# Patient Record
Sex: Female | Born: 1935 | Marital: Married | Smoking: Never smoker
Health system: Southern US, Community
[De-identification: ages and names within clinical notes are randomized; demographics above are authoritative.]

## PROBLEM LIST (undated history)

## (undated) DIAGNOSIS — E119 Type 2 diabetes mellitus without complications: Secondary | ICD-10-CM

## (undated) DIAGNOSIS — I639 Cerebral infarction, unspecified: Secondary | ICD-10-CM

## (undated) DIAGNOSIS — E785 Hyperlipidemia, unspecified: Secondary | ICD-10-CM

## (undated) DIAGNOSIS — I10 Essential (primary) hypertension: Secondary | ICD-10-CM

## (undated) HISTORY — DX: Type 2 diabetes mellitus without complications: E11.9

## (undated) HISTORY — DX: Hyperlipidemia, unspecified: E78.5

---

## 2010-10-01 ENCOUNTER — Emergency Department: Payer: Self-pay | Admitting: Emergency Medicine

## 2011-11-21 ENCOUNTER — Observation Stay: Payer: Self-pay | Admitting: Internal Medicine

## 2011-11-21 LAB — BASIC METABOLIC PANEL
Calcium, Total: 8.8 mg/dL (ref 8.5–10.1)
Chloride: 103 mmol/L (ref 98–107)
Co2: 26 mmol/L (ref 21–32)
EGFR (African American): >60
EGFR (Non-African Amer.): >60
Glucose: 482 mg/dL — ABNORMAL HIGH (ref 65–99)
Osmolality: 300 (ref 275–301)
Potassium: 4.1 mmol/L (ref 3.5–5.1)

## 2011-11-21 LAB — URINALYSIS, COMPLETE
Bilirubin,UR: NEGATIVE
Blood: NEGATIVE
Ketone: NEGATIVE
Nitrite: NEGATIVE
Protein: NEGATIVE
RBC,UR: 1 /HPF (ref 0–5)
WBC UR: 12 /HPF (ref 0–5)

## 2011-11-21 LAB — CBC
MCHC: 35.5 g/dL (ref 32.0–36.0)
Platelet: 142 10*3/uL — ABNORMAL LOW (ref 150–440)
RDW: 13.6 % (ref 11.5–14.5)
WBC: 3.8 10*3/uL (ref 3.6–11.0)

## 2011-11-21 LAB — HEPATIC FUNCTION PANEL A (ARMC)
Albumin: 4 g/dL (ref 3.4–5.0)
Alkaline Phosphatase: 98 U/L (ref 50–136)
Bilirubin,Total: 0.4 mg/dL (ref 0.2–1.0)
SGOT(AST): 21 U/L (ref 15–37)
SGPT (ALT): 19 U/L (ref 12–78)

## 2011-11-21 LAB — TROPONIN I: Troponin-I: <0.02 ng/mL

## 2011-11-21 LAB — CK TOTAL AND CKMB (NOT AT ARMC)
CK, Total: 56 U/L (ref 21–215)
CK-MB: 1.3 ng/mL (ref 0.5–3.6)

## 2011-12-26 LAB — CBC AND DIFFERENTIAL: Platelets: 132 — AB (ref 150–399)

## 2011-12-26 LAB — LIPID PANEL: Triglycerides: 237 — AB (ref 40–160)

## 2013-12-22 LAB — LIPID PANEL: Cholesterol: 212 — AB (ref 0–200)

## 2014-01-17 LAB — LIPID PANEL: LDL Cholesterol: 131

## 2014-01-27 LAB — BASIC METABOLIC PANEL
Glucose: 143
Glucose: 143
Glucose: 143

## 2014-01-27 LAB — HEMOGLOBIN A1C
Hemoglobin A1C: 7.2
Hemoglobin A1C: 7.2

## 2014-01-27 LAB — LIPID PANEL: LDL Cholesterol: 131

## 2014-04-02 ENCOUNTER — Emergency Department: Payer: Self-pay | Admitting: Emergency Medicine

## 2014-05-19 NOTE — H&P (Signed)
PATIENT NAME:  Annette, Crawford MR#:  [id number] DATE OF BIRTH:  01/30/1932  DATE OF ADMISSION:  11/21/2011  PRIMARY CARE PHYSICIAN: She has no local doctor.   REFERRING PHYSICIAN: Dr. Clemens Catholic.    CHIEF COMPLAINT: Chest pain.   HISTORY OF PRESENT ILLNESS: Annette Crawford is a 80 year old pleasant Caucasian female originally from the [place]. She comes to the [place] from time to time for a visit for a while. She has history of diabetes mellitus type 2, but she is out of her medications for more than a year. She is not checking her diabetes. Today, she was complaining of feeling unwell, a little dizziness and also she complains of chest pain for the last one year. However, the chest pain today was worse than ever, located at the midsternal area slightly towards the right of the midchest. Described as pressure-like feeling. The severity is about 3 to 4 on a scale of 10. She adds to that it is like pressure or something sitting on the chest. Pain is nonradiating. There is mild shortness of breath. No associated sweating or palpitations or nausea or vomiting. She indicates that the pain is worse upon walking.   REVIEW OF SYSTEMS: CONSTITUTIONAL: Denies any fever. No chills. No fatigue. EYES: No blurring of vision. No double vision. ENT: No hearing impairment. No sore throat. No dysphagia. CARDIOVASCULAR: Reports the chest pain as above and mild shortness of breath. No syncope. No palpitations. No edema. RESPIRATORY: No cough. No sputum production. No hemoptysis. GASTROINTESTINAL: No abdominal pain. No vomiting. No diarrhea. GENITOURINARY: No dysuria or frequency of urination. MUSCULOSKELETAL: No joint pain or swelling. No muscular pain or swelling. INTEGUMENTARY: No skin rash. No ulcer. She has a small lump on the back of the right side of the neck for a long time. She had surgery to that but it came back according to the patient. It appears it is a cyst. This has not changed. She indicates  that she had removal of this lesion before and she was told it was benign. NEUROLOGY: No focal weakness. No seizure activity. No headache. PSYCHIATRY: No anxiety. No depression. ENDOCRINE: She has mild polyuria and mild polydipsia. No heat or cold intolerance.   PAST MEDICAL HISTORY:  1. Diabetes mellitus type 2, noncompliant with her medications or visits.  2. She denies history of hypertension, although she has hypertension.  3. No past history of coronary artery disease or stroke or kidney disease.   PAST SURGICAL HISTORY: She had surgery to benign lump on the back of her neck many years ago and it recurred and was slowly growing for many years.   SOCIAL HABITS: Nonsmoker. No history of alcohol or drug abuse.   FAMILY HISTORY: She reports that her mother had diabetes mellitus, type II and she also suffered from the bowel cancer. She has a brother who has leukemia and a sister who has colon cancer.   SOCIAL HISTORY: She is married. Right now she is living with her son and she travels between the [place] and back to [place] to [place].   ADMISSION MEDICATIONS: None. However, her medications before for diabetes used to be metformin 1000 mg twice a day and glipizide 5 mg a day, but she is not taking them.   ALLERGIES: No known drug allergies.   PHYSICAL EXAMINATION:  VITAL SIGNS: Blood pressure 159/78, respiratory rate 18, pulse 64, temperature 98, oxygen saturation 99%.   GENERAL APPEARANCE: Elderly female lying in bed in no acute  distress.   HEAD: No pallor. No icterus. No cyanosis.   ENT: Hearing was normal. Nasal mucosa, lips, tongue were normal.   EYES: Normal eyelids and conjunctivae. Pupils about 3 mm, equal, sluggishly reactive to light.   NECK: Supple. Trachea at midline. No thyromegaly. No cervical lymphadenopathy.   HEART: Normal S1, S2. No S3, S4. No murmur. No gallop. No carotid bruits.   LUNGS: Normal breathing pattern without use of accessory muscles. No  rales. No wheezing.   ABDOMEN: Soft without tenderness. No hepatosplenomegaly. No masses. No hernias.   SKIN: No ulcers. No subcutaneous nodules.   MUSCULOSKELETAL: No joint swelling. No clubbing.   NEUROLOGIC: Cranial nerves II through XII are intact. No focal motor deficit.   PSYCHIATRIC: The patient is alert and oriented x3. Mood and affect were normal.   LABORATORY, DIAGNOSTIC, AND RADIOLOGICAL DATA: Chest x-ray showed heart size was normal. No acute cardiopulmonary abnormalities. EKG showed normal sinus rhythm at rate of 71 per minute. Nonspecific T wave abnormalities in Leads III and aVF, otherwise unremarkable EKG. Blood work-up showed blood sugar 503, BUN 17, creatinine 0.8, sodium 139, potassium 4.1 and hemoglobin A1c was 12. Normal liver function tests. CPK 56. Troponin less than 0.02. CBC showed white count of 3,800, hemoglobin 14, hematocrit 40, platelet count 142. Urinalysis showed more than 500 glucose, 12 white blood cells and otherwise unremarkable.   ASSESSMENT:  1. Chest pain for evaluation. Given her risk factors, it needs further evaluation given her risk factors given the age, diabetes and underlying hypertension.  2. Elevated blood pressure, may indicate underlying hypertension.  3. Diabetes mellitus type 2, uncontrolled.  4. Mild urinary tract infection.   PLAN:  1. We will admit the patient for observation, IV hydration, Accu-Cheks and follow-up on blood sugar. The latest blood sugar after treatment is down to 286.  2. We will follow up on her troponin q.8 hours x2. Will schedule the patient to have dobutamine Cardiolite stress test or dobutamine Myoview since the patient is unable to walk fast on the treadmill.  3. I will start ACE inhibitor, lisinopril 10 mg a day.  4. Resume metformin and glipizide.  5. Aspirin 325 mg p.o. daily.  6. I will place her on nitroglycerin sublingual 0.4 mg every five minutes p.r.n. should she have chest pain.  7. The patient was  advised to have regular follow-up with a local physician.  8. I understood from the patient that she does not have a LIVING WILL and her CODE STATUS is FULL CODE.   TIME SPENT EVALUATING THIS PATIENT: More than 55 minutes.   ____________________________ Carney CornersAmir M. Rudene Rearwish, MD amd:ap D: 11/21/2011 06:08:06 ET T: 11/21/2011 08:48:16 ET JOB#: [id number]  cc: Carney CornersAmir M. Rudene Rearwish, MD, <Dictator> Zollie ScaleAMIR M Chrissa Meetze MD ELECTRONICALLY SIGNED 11/21/2011 22:32

## 2014-05-19 NOTE — Discharge Summary (Signed)
PATIENT NAME:  Annette Crawford, Annette Crawford MR#:  [id number] DATE OF Crawford:  01/30/1932  DATE OF ADMISSION:  11/21/2011 DATE OF DISCHARGE:  11/21/2011  PRIMARY CARE PHYSICIAN: Beverely RisenFozia Khan, MD   PRESENTING COMPLAINT: Chest pain on and off for one year and uncontrolled blood sugars.   DISCHARGE DIAGNOSES:  1. Chest pain, appears atypical. The patient was ruled out for acute myocardial infarction with three sets of negative cardiac enzymes and normal Myoview stress test, ejection fraction of 62%.  2. Uncontrolled type 2 diabetes due to medical noncompliance.  3. Hypertension, new diagnosis.   PROCEDURE: Myoview stress test normal, negative for ischemia, ejection fraction of 62%.    FOLLOWUP: Follow up with Dr. Beverely RisenFozia Khan 12/04/2011 at 8:30 a.m.   MEDICATIONS AT DISCHARGE:  1. Metformin 500 mg, 2 tablets b.i.d. 2. Glipizide 5 mg, 1 tablet daily.  3. Aspirin 81 mg daily.  4. Lisinopril 10 mg daily.  5. Metoprolol 25 mg p.o. b.i.d.   DIET: Low sodium, carbohydrate controlled diet.   CONSULTATIONS: None.   LABORATORY, DIAGNOSTIC AND RADIOLOGICAL DATA: Cardiac enzymes x3 negative. Urinalysis negative for urinary tract infection. Positive for glucosuria. Negative for proteinuria. Chest x-ray: No acute disease of the chest. CT of the head shows no acute intracranial process. CBC was within normal limits except platelet count of 142. Basic metabolic panel within normal limits except glucose of 482. LFTs within normal limits. Hemoglobin A1c is 12.0. EKG showed normal sinus rhythm, minimal voltage criteria for left ventricular hypertrophy.   HOSPITAL COURSE: Annette Crawford is a 80 year old female of [place] origin who speaks Arabic, presents to the Emergency Room with:   1. Chest pain: Her history was obtained via an Arabic interpreter. The patient reported having on and off chest discomfort for one year. She was not evaluated as outpatient. She does not follow up with a physician since she has  been in the [place]. She has been off of her medications as well. The patient was admitted. She was ruled out with three sets of negative cardiac enzymes, normal EKG, and normal Myoview stress test. Her symptoms appeared atypical.  2. Hypertension, new diagnosis: The patient was started on beta blockers and lisinopril.  3. Type 2 diabetes, uncontrolled in the setting of noncompliance to medication, no followup with physician here in the [place]: The patient was resumed back on her glipizide and metoprolol. We did make an appointment for her to be seen by Dr. Beverely RisenFozia Khan. The importance for follow-up appointment was discussed with the patient's son over the phone.   Overall hospital stay remained stable. The discharge plan was discussed with the patient's son.  CODE STATUS:  The patient remained a FULL CODE.    TIME SPENT: 40 minutes   ____________________________ Jearl KlinefelterSona A. Allena KatzPatel, MD sap:cbb D: 11/22/2011 07:09:28 ET T: 11/22/2011 09:35:52 ET JOB#: [id number]  cc: Jakelin Taussig A. Allena KatzPatel, MD, <Dictator> Lyndon CodeFozia M. Khan, MD Willow OraSONA A Nithin Demeo MD ELECTRONICALLY SIGNED 11/22/2011 11:25

## 2014-06-01 ENCOUNTER — Encounter: Payer: Self-pay | Admitting: Emergency Medicine

## 2014-06-01 ENCOUNTER — Emergency Department: Payer: Medicaid Other

## 2014-06-01 ENCOUNTER — Other Ambulatory Visit: Payer: Self-pay

## 2014-06-01 ENCOUNTER — Emergency Department
Admission: EM | Admit: 2014-06-01 | Discharge: 2014-06-02 | Disposition: A | Discharge status: Home / Self Care | Payer: Medicaid Other | Attending: Emergency Medicine | Admitting: Emergency Medicine

## 2014-06-01 DIAGNOSIS — R531 Weakness: Secondary | ICD-10-CM | POA: Diagnosis not present

## 2014-06-01 DIAGNOSIS — E119 Type 2 diabetes mellitus without complications: Secondary | ICD-10-CM | POA: Insufficient documentation

## 2014-06-01 DIAGNOSIS — R112 Nausea with vomiting, unspecified: Secondary | ICD-10-CM

## 2014-06-01 DIAGNOSIS — I10 Essential (primary) hypertension: Secondary | ICD-10-CM | POA: Diagnosis not present

## 2014-06-01 HISTORY — DX: Essential (primary) hypertension: I10

## 2014-06-01 HISTORY — DX: Cerebral infarction, unspecified: I63.9

## 2014-06-01 HISTORY — DX: Type 2 diabetes mellitus without complications: E11.9

## 2014-06-01 LAB — CBC WITH DIFFERENTIAL/PLATELET
Basophils Absolute: 0 10*3/uL (ref 0–0.1)
Basophils Relative: 0 %
Eosinophils Absolute: 0 10*3/uL (ref 0–0.7)
Eosinophils Relative: 1 %
HCT: 35.9 % (ref 35.0–47.0)
Hemoglobin: 11.9 g/dL — ABNORMAL LOW (ref 12.0–16.0)
LYMPHS PCT: 13 %
Lymphs Abs: 0.9 10*3/uL — ABNORMAL LOW (ref 1.0–3.6)
MCH: 28.1 pg (ref 26.0–34.0)
MCHC: 33.1 g/dL (ref 32.0–36.0)
MCV: 84.9 fL (ref 80.0–100.0)
MONO ABS: 0.5 10*3/uL (ref 0.2–0.9)
Monocytes Relative: 7 %
NEUTROS PCT: 79 %
Neutro Abs: 5.3 10*3/uL (ref 1.4–6.5)
PLATELETS: 148 10*3/uL — AB (ref 150–440)
RBC: 4.23 MIL/uL (ref 3.80–5.20)
RDW: 14.1 % (ref 11.5–14.5)
WBC: 6.7 10*3/uL (ref 3.6–11.0)

## 2014-06-01 LAB — COMPREHENSIVE METABOLIC PANEL
ALT: 12 U/L — ABNORMAL LOW (ref 14–54)
ANION GAP: 10 (ref 5–15)
AST: 22 U/L (ref 15–41)
Albumin: 4.2 g/dL (ref 3.5–5.0)
Alkaline Phosphatase: 59 U/L (ref 38–126)
BUN: 21 mg/dL — AB (ref 6–20)
CHLORIDE: 99 mmol/L — AB (ref 101–111)
CO2: 28 mmol/L (ref 22–32)
Calcium: 9.3 mg/dL (ref 8.9–10.3)
Creatinine, Ser: 0.93 mg/dL (ref 0.44–1.00)
GFR calc Af Amer: >60 mL/min (ref >60)
GFR calc non Af Amer: 57 mL/min — ABNORMAL LOW (ref >60)
GLUCOSE: 121 mg/dL — AB (ref 65–99)
Potassium: 4.3 mmol/L (ref 3.5–5.1)
Sodium: 137 mmol/L (ref 135–145)
TOTAL PROTEIN: 7.1 g/dL (ref 6.5–8.1)
Total Bilirubin: 0.7 mg/dL (ref 0.3–1.2)

## 2014-06-01 LAB — TROPONIN I

## 2014-06-01 LAB — LIPASE, BLOOD: Lipase: 36 U/L (ref 22–51)

## 2014-06-01 MED ORDER — LISINOPRIL-HYDROCHLOROTHIAZIDE 20-12.5 MG PO TABS: 1.0000 | ORAL_TABLET | Freq: Two times a day (BID) | ORAL | Status: DC

## 2014-06-01 MED ORDER — IOHEXOL 240 MG/ML SOLN: 25.0000 mL | Freq: Once | INTRAMUSCULAR | Status: AC | PRN

## 2014-06-01 MED ORDER — ATORVASTATIN CALCIUM 10 MG PO TABS: 10.0000 mg | ORAL_TABLET | Freq: Every day | ORAL | Status: DC

## 2014-06-01 MED ORDER — GLIMEPIRIDE 2 MG PO TABS: 2.0000 mg | ORAL_TABLET | Freq: Three times a day (TID) | ORAL | Status: DC

## 2014-06-01 MED ORDER — IOHEXOL 300 MG/ML  SOLN
100.0000 mL | Freq: Once | INTRAMUSCULAR | Status: AC | PRN
  Administered 2014-06-01: 100 mL via INTRAVENOUS

## 2014-06-01 NOTE — ED Notes (Signed)
MD at bedside. 

## 2014-06-01 NOTE — ED Notes (Signed)
Pt resting quietly in stretcher, resp even and unlabored. Call bell within reach.  No needs expressed at this time. Will continue to monitor.

## 2014-06-01 NOTE — ED Notes (Signed)
Pt unable to use bedpan at this time. Pt going to CT at this time, will in&out once she returns from CT.

## 2014-06-01 NOTE — ED Notes (Signed)
Pt to CT at this time.

## 2014-06-01 NOTE — ED Notes (Signed)
Family states via interpreter language line that pt has had n,v and weakness since 1000 this am, has had 4 previous stroke in the past per family, pt denies pain, pt has 4 family member with her all family member giving information to interpreter at the same time, difficult to get good history

## 2014-06-01 NOTE — Discharge Instructions (Signed)

## 2014-06-01 NOTE — ED Notes (Signed)
Pt states able to try and urinate at this time, placed on bedpan, tolerating well at this time. Pt asked for RN to give her a minute alone, RN will check back in momentarily.

## 2014-06-01 NOTE — ED Provider Notes (Signed)
[facility] Emergency Department Provider Note    ____________________________________________  Time seen: 21:00  I have reviewed the triage vital signs and the nursing notes.   HISTORY  Chief Complaint Weakness and Emesis   History is limited by language; the patient and family aren't native aerobics speakers. The interpreter line was used when possible, and a family member who does speak English was also utilized.    HPI Annette Crawford is a 80 y.o. female with a history significant for hypertension, diabetes, and prior strokes 4 who presents with rapid onset of generalized weakness, nausea, and multiple episodes of vomiting since earlier this morning. In the past when she has had a CVA she has had more focal neuro deficits, and in fact she does not have the use of her left hand due to a prior stroke. Today she has not complaining of any neuro deficits, just persistent vomiting and generalized weakness. The symptoms began approximately 11 hours ago.She has not had an appetite today and has been vomiting in spite of not eating. She denies chest pain and shortness of breath.   Past Medical History  Diagnosis Date  . Diabetes mellitus without complication   . Hypertension   . Stroke     There are no active problems to display for this patient.   History reviewed. No pertinent past surgical history.  No current outpatient prescriptions on file.  Allergies Review of patient's allergies indicates no known allergies.  History reviewed. No pertinent family history.  Social History History  Substance Use Topics  . Smoking status: Never Smoker   . Smokeless tobacco: Not on file  . Alcohol Use: No    Review of Systems  Constitutional: Negative for fever. Eyes: Negative for visual changes. ENT: Negative for sore throat. Cardiovascular: Negative for chest pain. Respiratory: Negative for shortness of breath. Gastrointestinal: Negative  for abdominal pain, positive for several episodes of vomiting.. Genitourinary: Negative for dysuria. Musculoskeletal: Negative for back pain. Skin: Negative for rash. Neurological: Negative for headaches, focal weakness or numbness.   10-point ROS otherwise negative.  ____________________________________________   PHYSICAL EXAM:  VITAL SIGNS: ED Triage Vitals  Enc Vitals Group     BP 06/01/14 1614 152/61 mmHg     Pulse Rate 06/01/14 1614 95     Resp 06/01/14 1654 18     Temp 06/01/14 1614 98.1 F (36.7 C)     Temp Source 06/01/14 1614 Oral     SpO2 06/01/14 1614 99 %     Weight 06/01/14 1614 121 lb (54.885 kg)     Height 06/01/14 1614  (1.702 m)     Head Cir --      Peak Flow --      Pain Score --      Pain Loc --      Pain Edu? --      Excl. in GC? --      Constitutional: Alert and oriented. Well appearing and in no distress. Eyes: Conjunctivae are normal. PERRL. Normal extraocular movements. ENT   Head: Normocephalic and atraumatic.   Nose: No congestion/rhinnorhea.   Mouth/Throat: Mucous membranes are moist.   Neck: No stridor. Hematological/Lymphatic/Immunilogical: No cervical lymphadenopathy. Cardiovascular: Normal rate, regular rhythm. Normal and symmetric distal pulses are present in all extremities. No murmurs, rubs, or gallops. Respiratory: Normal respiratory effort without tachypnea nor retractions. Breath sounds are clear and equal bilaterally. No wheezes/rales/rhonchi. Gastrointestinal: Soft with diffuse lower extremity tenderness to palpation. No guarding.. No distention.  No abdominal bruits. There is no CVA tenderness. Genitourinary: Deferred Musculoskeletal: Nontender with normal range of motion in all extremities. No joint effusions.  No lower extremity tenderness nor edema. Neurologic:  Normal speech and language. No acute gross focal neurologic deficits are appreciated; patient has chronic inability to use left upper extremity,  specifically no grip strength. Speech is normal.  Skin:  Skin is warm, dry and intact. No rash noted. Psychiatric: Mood and affect are normal. Speech and behavior are normal. Patient exhibits appropriate insight and judgment.  ____________________________________________    LABS (pertinent positives/negatives)  Chemistry, CBC, and troponin x2 are all unremarkable.  ____________________________________________   EKG  ED ECG REPORT   Date: 06/01/2014  EKG Time: 16:47  Rate: 68  Rhythm: normal sinus rhythm  Axis: Normal  Intervals:none  ST&T Change: Nonspecific ST and T-wave changes   ____________________________________________    RADIOLOGY  Head CT report from radiology describes white matter ischemic changes in the right cerebral hemisphere that are new from the prior CT but appear chronic. There is also evidence of a small remote left cerebellar infarction.  CT scan of the abdomen and pelvis with IV contrast is unremarkable  ____________________________________________   PROCEDURES  Procedure(s) performed: None  Critical Care performed: No  ____________________________________________   INITIAL IMPRESSION / ASSESSMENT AND PLAN / ED COURSE  Pertinent labs & imaging results that were available during my care of the patient were reviewed by me and considered in my medical decision making (see chart for details).  The patient is in no acute distress though she has had apparently 3-4 episodes of vomiting today with decreased by mouth intake. Her vital signs are stable and her initial blood work is unremarkable. She is tender to palpation of the abdomen, however, so further investigation is warranted. I will evaluate with a urinalysis and a CT abdomen and pelvis. At this point I do not believe that her symptoms are result of an acute CVA, though given her history of multiple CVAs which continue to include this on the differential  diagnosis.  ----------------------------------------- 11:43 PM on 06/01/2014 -----------------------------------------  The patient's CT scan was unremarkable and she has been in no distress during her lengthy emergency department stay. Her vital signs remained stable. Again, there are no signs that she is having an acute CVA. I am awaiting a second troponin, but I anticipate discussing via the language line with the patient's family the encouraging results and recommending close outpatient follow-up. I also refilled some of the patient's chronic medications for blood pressure and diabetes and hypercholesterolemia because her primary care doctor is out of town and the family was worried that she would not have access to medication. ____________________________________________   FINAL CLINICAL IMPRESSION(S) / ED DIAGNOSES  Final diagnoses:  Nausea and vomiting, vomiting of unspecified type     Loleta Roseory Merric Yost, MD 06/02/14 0028

## 2014-06-02 ENCOUNTER — Encounter: Payer: Self-pay | Admitting: Emergency Medicine

## 2014-06-02 LAB — URINALYSIS COMPLETE WITH MICROSCOPIC (ARMC ONLY)
BACTERIA UA: NONE SEEN
Bilirubin Urine: NEGATIVE
GLUCOSE, UA: NEGATIVE mg/dL
HGB URINE DIPSTICK: NEGATIVE
KETONES UR: NEGATIVE mg/dL
Leukocytes, UA: NEGATIVE
Nitrite: NEGATIVE
Protein, ur: NEGATIVE mg/dL
SPECIFIC GRAVITY, URINE: 1.019 (ref 1.005–1.030)
SQUAMOUS EPITHELIAL / LPF: NONE SEEN
pH: 7 (ref 5.0–8.0)

## 2014-06-02 LAB — TROPONIN I: TROPONIN I: 0.03 ng/mL (ref <0.031)

## 2014-06-02 NOTE — ED Notes (Signed)
Calling lab at this time to obtain update on trop results

## 2014-06-02 NOTE — ED Provider Notes (Signed)
-----------------------------------------   1:19 AM on 06/02/2014 -----------------------------------------  Assuming care from Dr. York CeriseForbach.  In short, Annette Crawford is a 80 y.o. female with a chief complaint of Weakness and Emesis .  Refer to the original H&P for additional details.  The patient's urinalysis and second troponin have resulted as normal. We'll discharge the patient home with primary care follow-up. Patient agreeable to plan.   Minna AntisKevin Cheryel Kyte, MD 06/02/14 [phone]

## 2014-06-02 NOTE — ED Notes (Signed)
Awaiting UA and troponin results from lab. Per lab they are in process.

## 2014-06-02 NOTE — ED Notes (Signed)
Pt alert and in NAD at time of d/c to family via w/c.

## 2014-09-18 ENCOUNTER — Other Ambulatory Visit: Payer: Self-pay | Admitting: Family Medicine

## 2014-09-18 ENCOUNTER — Encounter: Payer: Self-pay | Admitting: Family Medicine

## 2014-09-18 ENCOUNTER — Ambulatory Visit: Payer: Self-pay | Admitting: Family Medicine

## 2014-09-18 DIAGNOSIS — E1151 Type 2 diabetes mellitus with diabetic peripheral angiopathy without gangrene: Secondary | ICD-10-CM | POA: Insufficient documentation

## 2014-09-18 DIAGNOSIS — I69354 Hemiplegia and hemiparesis following cerebral infarction affecting left non-dominant side: Secondary | ICD-10-CM | POA: Insufficient documentation

## 2014-09-18 DIAGNOSIS — E785 Hyperlipidemia, unspecified: Secondary | ICD-10-CM | POA: Insufficient documentation

## 2014-09-18 DIAGNOSIS — I10 Essential (primary) hypertension: Secondary | ICD-10-CM | POA: Insufficient documentation

## 2014-09-18 DIAGNOSIS — R202 Paresthesia of skin: Secondary | ICD-10-CM | POA: Insufficient documentation

## 2014-12-04 ENCOUNTER — Other Ambulatory Visit: Payer: Self-pay | Admitting: Family Medicine

## 2014-12-04 MED ORDER — GLIMEPIRIDE 2 MG PO TABS: 2.0000 mg | ORAL_TABLET | Freq: Three times a day (TID) | ORAL | Status: DC

## 2014-12-04 MED ORDER — ATORVASTATIN CALCIUM 10 MG PO TABS: 10.0000 mg | ORAL_TABLET | Freq: Every day | ORAL | Status: DC

## 2014-12-04 MED ORDER — LISINOPRIL-HYDROCHLOROTHIAZIDE 20-12.5 MG PO TABS: 1.0000 | ORAL_TABLET | Freq: Two times a day (BID) | ORAL | Status: DC

## 2014-12-04 MED ORDER — METFORMIN HCL 500 MG PO TABS: 500.0000 mg | ORAL_TABLET | Freq: Three times a day (TID) | ORAL | Status: DC

## 2014-12-16 ENCOUNTER — Ambulatory Visit (INDEPENDENT_AMBULATORY_CARE_PROVIDER_SITE_OTHER): Payer: Medicaid Other | Admitting: Family Medicine

## 2014-12-16 ENCOUNTER — Encounter: Payer: Self-pay | Admitting: Family Medicine

## 2014-12-16 VITALS — BP 126/62 | HR 89 | Temp 98.0°F | Resp 12 | Wt 134.8 lb

## 2014-12-16 DIAGNOSIS — E1151 Type 2 diabetes mellitus with diabetic peripheral angiopathy without gangrene: Secondary | ICD-10-CM

## 2014-12-16 DIAGNOSIS — R202 Paresthesia of skin: Secondary | ICD-10-CM

## 2014-12-16 DIAGNOSIS — E785 Hyperlipidemia, unspecified: Secondary | ICD-10-CM

## 2014-12-16 DIAGNOSIS — I10 Essential (primary) hypertension: Secondary | ICD-10-CM

## 2014-12-16 MED ORDER — ACCU-CHEK SOFT TOUCH LANCETS MISC: Status: DC

## 2014-12-16 MED ORDER — ACCU-CHEK NANO SMARTVIEW W/DEVICE KIT: 1.0000 | PACK | Freq: Three times a day (TID) | Status: DC

## 2014-12-16 MED ORDER — METFORMIN HCL 500 MG PO TABS: 500.0000 mg | ORAL_TABLET | Freq: Three times a day (TID) | ORAL | Status: AC

## 2014-12-16 MED ORDER — LISINOPRIL-HYDROCHLOROTHIAZIDE 20-12.5 MG PO TABS: 1.0000 | ORAL_TABLET | Freq: Two times a day (BID) | ORAL | Status: AC

## 2014-12-16 MED ORDER — GLUCOSE BLOOD VI STRP: ORAL_STRIP | Status: DC

## 2014-12-16 MED ORDER — ATORVASTATIN CALCIUM 10 MG PO TABS: 10.0000 mg | ORAL_TABLET | Freq: Every day | ORAL | Status: AC

## 2014-12-16 MED ORDER — GLIMEPIRIDE 2 MG PO TABS: 2.0000 mg | ORAL_TABLET | Freq: Three times a day (TID) | ORAL | Status: DC

## 2014-12-16 NOTE — Progress Notes (Signed)
Name: Annette Crawford   MRN: [id number]    DOB: [birth year]   Date:12/16/2014       Progress Note  Subjective  Chief Complaint  Chief Complaint  Patient presents with  . Medication Refill    HPI  Annette Crawford is a 80 year old female with known history of DM II, HTN, HLD, CVA with residual deficits left upper extremity worse than lower. Today she is here for 6 month follow up of diabetes type 2. Son and granddaughter accompany her today. Language barrier noted. Patient does not speak or understand [place] and family has very broken understanding of [place]. Interpretive services used. Son explains that [name] has had a dry cough some times for months. Otherwise left arm still weak and shoulder can be painful. They are also requesting a new glucometer and testing supplies.   Past Medical History  Diagnosis Date  . Diabetes mellitus without complication ([place])   . Hypertension   . Stroke ([place])   . Hyperlipidemia     Patient Active Problem List   Diagnosis Date Noted  . History of CVA (cerebrovascular accident) without residual deficits 09/18/2014  . Paresthesia of left upper extremity 09/18/2014  . Controlled type 2 DM with peripheral circulatory disorder ([place]) 09/18/2014  . Hyperlipidemia LDL goal <70 09/18/2014  . Hypertension goal BP (blood pressure) < 140/90 09/18/2014    Social History  Substance Use Topics  . Smoking status: Never Smoker   . Smokeless tobacco: Not on file  . Alcohol Use: No     Current outpatient prescriptions:  .  atorvastatin (LIPITOR) 10 MG tablet, Take 1 tablet (10 mg total) by mouth daily., Disp: 90 tablet, Rfl: 2 .  Blood Glucose Monitoring Suppl (ACCU-CHEK NANO SMARTVIEW) W/DEVICE KIT, 1 Device by Does not apply route 3 (three) times daily., Disp: 1 kit, Rfl: 0 .  glimepiride (AMARYL) 2 MG tablet, Take 1 tablet (2 mg total) by mouth 3 (three) times daily., Disp: 270 tablet, Rfl: 2 .  glucose blood (ACCU-CHEK SMARTVIEW) test strip, Use as  instructed, Disp: 100 each, Rfl: 12 .  Lancets (ACCU-CHEK SOFT TOUCH) lancets, Use as instructed to check blood glucose 3x/day, Disp: 100 each, Rfl: 5 .  lisinopril-hydrochlorothiazide (ZESTORETIC) 20-12.5 MG tablet, Take 1 tablet by mouth 2 (two) times daily., Disp: 180 tablet, Rfl: 2 .  metFORMIN (GLUCOPHAGE) 500 MG tablet, Take 1 tablet (500 mg total) by mouth 3 (three) times daily., Disp: 270 tablet, Rfl: 2  History reviewed. No pertinent past surgical history.  Family History  Problem Relation Age of Onset  . Hyperlipidemia Mother   . Hypertension Mother   . Hyperlipidemia Father   . Hypertension Father     No Known Allergies   Review of Systems  CONSTITUTIONAL: No significant weight changes, fever, chills, weakness or fatigue.  SKIN: No rash or itching.  CARDIOVASCULAR: No chest pain, chest pressure or chest discomfort. No palpitations or edema.  RESPIRATORY: No shortness of breath. NEUROLOGICAL: No headache, dizziness, syncope, paralysis, ataxia. Yes numbness or tingling in the left upper extremity. No memory changes. No change in bowel or bladder control.  MUSCULOSKELETAL: Yes joint pain. No muscle pain. PSYCHIATRIC: No change in mood. No change in sleep pattern.  ENDOCRINOLOGIC: No reports of sweating, cold or heat intolerance. No polyuria or polydipsia.     Objective  BP 126/62 mmHg  Pulse 89  Temp(Src) 98 F (36.7 C) (Oral)  Resp 12  Wt 134 lb 12.8 oz (61.145 kg)  SpO2 97%  Body mass index is 26.33 kg/(m^2).  Physical Exam  Constitutional: Patient appears well-developed and well-nourished. In no distress. Quite but cooperative. Using cane to walk. Cardiovascular: Normal rate, regular rhythm and normal heart sounds.  No murmur heard.  Pulmonary/Chest: Effort normal and breath sounds normal. No respiratory distress. Musculoskeletal: Left LE 4/5 strength, Left UE 3/5 strength. Sensation intact.  Peripheral vascular: Bilateral LE no edema. Neurological: CN  II-XII grossly intact with no focal deficits. Alert and oriented to person, place, and time.  Skin: Skin is warm and dry. No rash noted. No erythema.  Psychiatric: Patient has a normal mood and affect. Behavior is normal in office today. Judgment and thought content normal in office today.   Assessment & Plan   1. Controlled type 2 DM with peripheral circulatory disorder (HCC) Unable to get sufficient blood sample for in office Hba1c testing, will send to lab.  - Hemoglobin A1c - Lancets (ACCU-CHEK SOFT TOUCH) lancets; Use as instructed to check blood glucose 3x/day  Dispense: 100 each; Refill: 5 - Blood Glucose Monitoring Suppl (ACCU-CHEK NANO SMARTVIEW) W/DEVICE KIT; 1 Device by Does not apply route 3 (three) times daily.  Dispense: 1 kit; Refill: 0 - glucose blood (ACCU-CHEK SMARTVIEW) test strip; Use as instructed  Dispense: 100 each; Refill: 12 - glimepiride (AMARYL) 2 MG tablet; Take 1 tablet (2 mg total) by mouth 3 (three) times daily.  Dispense: 270 tablet; Refill: 2 - metFORMIN (GLUCOPHAGE) 500 MG tablet; Take 1 tablet (500 mg total) by mouth 3 (three) times daily.  Dispense: 270 tablet; Refill: 2  2. Paresthesia of left upper extremity Stable.  3. Hyperlipidemia LDL goal <70 Recheck.  - Lipid panel - atorvastatin (LIPITOR) 10 MG tablet; Take 1 tablet (10 mg total) by mouth daily.  Dispense: 90 tablet; Refill: 2  4. Hypertension goal BP (blood pressure) < 140/90 Stable.  - lisinopril-hydrochlorothiazide (ZESTORETIC) 20-12.5 MG tablet; Take 1 tablet by mouth 2 (two) times daily.  Dispense: 180 tablet; Refill: 2

## 2014-12-17 LAB — LIPID PANEL
Chol/HDL Ratio: 3.8 ratio units (ref 0.0–4.4)
Cholesterol, Total: 149 mg/dL (ref 100–199)
HDL: 39 mg/dL — ABNORMAL LOW (ref >39)
LDL CALC: 81 mg/dL (ref 0–99)
Triglycerides: 146 mg/dL (ref 0–149)
VLDL CHOLESTEROL CAL: 29 mg/dL (ref 5–40)

## 2014-12-17 LAB — HEMOGLOBIN A1C
Est. average glucose Bld gHb Est-mCnc: 131 mg/dL
Hgb A1c MFr Bld: 6.2 % — ABNORMAL HIGH (ref 4.8–5.6)

## 2014-12-30 ENCOUNTER — Telehealth: Payer: Self-pay | Admitting: Family Medicine

## 2014-12-30 ENCOUNTER — Telehealth: Payer: Self-pay

## 2014-12-30 ENCOUNTER — Other Ambulatory Visit: Payer: Self-pay | Admitting: Family Medicine

## 2014-12-30 DIAGNOSIS — E1151 Type 2 diabetes mellitus with diabetic peripheral angiopathy without gangrene: Secondary | ICD-10-CM

## 2014-12-30 MED ORDER — GLUCOSE BLOOD VI STRP: 1.0000 | ORAL_STRIP | Freq: Three times a day (TID) | Status: DC

## 2014-12-30 MED ORDER — ACCU-CHEK AVIVA PLUS W/DEVICE KIT: 1.0000 | PACK | Freq: Every day | Status: DC

## 2014-12-30 MED ORDER — ACCU-CHEK SOFTCLIX LANCETS MISC: 1.0000 | Freq: Three times a day (TID) | Status: DC

## 2014-12-30 NOTE — Telephone Encounter (Signed)
Pharmacist called to get clarity on glucometer. Dr. Sherley BoundsSundaram answered her questions.

## 2014-12-30 NOTE — Telephone Encounter (Signed)
[facility] SAID THAT THEY ALSO NEED RX FOR LANCETS FOR THE ACCU-CHECK AVIA PLUS.

## 2015-01-18 ENCOUNTER — Encounter: Payer: Self-pay | Admitting: *Deleted

## 2015-01-18 ENCOUNTER — Emergency Department: Payer: Medicaid Other

## 2015-01-18 ENCOUNTER — Observation Stay
Admission: EM | Admit: 2015-01-18 | Discharge: 2015-01-20 | Disposition: A | Discharge status: Home / Self Care | Payer: Medicaid Other | Attending: Internal Medicine | Admitting: Internal Medicine

## 2015-01-18 DIAGNOSIS — G459 Transient cerebral ischemic attack, unspecified: Secondary | ICD-10-CM | POA: Diagnosis present

## 2015-01-18 DIAGNOSIS — R29898 Other symptoms and signs involving the musculoskeletal system: Secondary | ICD-10-CM

## 2015-01-18 DIAGNOSIS — Z961 Presence of intraocular lens: Secondary | ICD-10-CM | POA: Insufficient documentation

## 2015-01-18 DIAGNOSIS — R079 Chest pain, unspecified: Secondary | ICD-10-CM | POA: Insufficient documentation

## 2015-01-18 DIAGNOSIS — F0630 Mood disorder due to known physiological condition, unspecified: Secondary | ICD-10-CM | POA: Diagnosis not present

## 2015-01-18 DIAGNOSIS — J9811 Atelectasis: Secondary | ICD-10-CM | POA: Diagnosis not present

## 2015-01-18 DIAGNOSIS — F329 Major depressive disorder, single episode, unspecified: Secondary | ICD-10-CM | POA: Diagnosis not present

## 2015-01-18 DIAGNOSIS — Z7984 Long term (current) use of oral hypoglycemic drugs: Secondary | ICD-10-CM | POA: Insufficient documentation

## 2015-01-18 DIAGNOSIS — R531 Weakness: Secondary | ICD-10-CM | POA: Insufficient documentation

## 2015-01-18 DIAGNOSIS — E785 Hyperlipidemia, unspecified: Secondary | ICD-10-CM | POA: Diagnosis not present

## 2015-01-18 DIAGNOSIS — R52 Pain, unspecified: Secondary | ICD-10-CM

## 2015-01-18 DIAGNOSIS — K449 Diaphragmatic hernia without obstruction or gangrene: Secondary | ICD-10-CM | POA: Diagnosis not present

## 2015-01-18 DIAGNOSIS — R1013 Epigastric pain: Secondary | ICD-10-CM | POA: Insufficient documentation

## 2015-01-18 DIAGNOSIS — R0602 Shortness of breath: Secondary | ICD-10-CM | POA: Diagnosis not present

## 2015-01-18 DIAGNOSIS — M8580 Other specified disorders of bone density and structure, unspecified site: Secondary | ICD-10-CM | POA: Diagnosis not present

## 2015-01-18 DIAGNOSIS — I10 Essential (primary) hypertension: Secondary | ICD-10-CM | POA: Diagnosis not present

## 2015-01-18 DIAGNOSIS — K209 Esophagitis, unspecified: Secondary | ICD-10-CM | POA: Insufficient documentation

## 2015-01-18 DIAGNOSIS — K760 Fatty (change of) liver, not elsewhere classified: Secondary | ICD-10-CM | POA: Insufficient documentation

## 2015-01-18 DIAGNOSIS — I69354 Hemiplegia and hemiparesis following cerebral infarction affecting left non-dominant side: Secondary | ICD-10-CM | POA: Diagnosis not present

## 2015-01-18 DIAGNOSIS — Z79899 Other long term (current) drug therapy: Secondary | ICD-10-CM | POA: Diagnosis not present

## 2015-01-18 DIAGNOSIS — E1151 Type 2 diabetes mellitus with diabetic peripheral angiopathy without gangrene: Secondary | ICD-10-CM | POA: Diagnosis not present

## 2015-01-18 DIAGNOSIS — R112 Nausea with vomiting, unspecified: Secondary | ICD-10-CM | POA: Diagnosis present

## 2015-01-18 DIAGNOSIS — K5900 Constipation, unspecified: Secondary | ICD-10-CM | POA: Diagnosis not present

## 2015-01-18 LAB — CBC
HEMATOCRIT: 35.6 % (ref 35.0–47.0)
Hemoglobin: 11.8 g/dL — ABNORMAL LOW (ref 12.0–16.0)
MCH: 28.4 pg (ref 26.0–34.0)
MCHC: 33 g/dL (ref 32.0–36.0)
MCV: 86.1 fL (ref 80.0–100.0)
PLATELETS: 142 10*3/uL — AB (ref 150–440)
RBC: 4.14 MIL/uL (ref 3.80–5.20)
RDW: 13.6 % (ref 11.5–14.5)
WBC: 5.1 10*3/uL (ref 3.6–11.0)

## 2015-01-18 LAB — HEPATIC FUNCTION PANEL
ALT: 11 U/L — ABNORMAL LOW (ref 14–54)
AST: 19 U/L (ref 15–41)
Albumin: 4 g/dL (ref 3.5–5.0)
Alkaline Phosphatase: 56 U/L (ref 38–126)
BILIRUBIN DIRECT: 0.3 mg/dL (ref 0.1–0.5)
BILIRUBIN INDIRECT: 0.6 mg/dL (ref 0.3–0.9)
TOTAL PROTEIN: 6.7 g/dL (ref 6.5–8.1)
Total Bilirubin: 0.9 mg/dL (ref 0.3–1.2)

## 2015-01-18 LAB — BASIC METABOLIC PANEL
Anion gap: 7 (ref 5–15)
BUN: 24 mg/dL — AB (ref 6–20)
CALCIUM: 8.9 mg/dL (ref 8.9–10.3)
CO2: 26 mmol/L (ref 22–32)
CREATININE: 0.84 mg/dL (ref 0.44–1.00)
Chloride: 105 mmol/L (ref 101–111)
GFR calc Af Amer: >60 mL/min (ref >60)
Glucose, Bld: 95 mg/dL (ref 65–99)
POTASSIUM: 4.3 mmol/L (ref 3.5–5.1)
SODIUM: 138 mmol/L (ref 135–145)

## 2015-01-18 LAB — URINALYSIS COMPLETE WITH MICROSCOPIC (ARMC ONLY)
BILIRUBIN URINE: NEGATIVE
Bacteria, UA: NONE SEEN
GLUCOSE, UA: NEGATIVE mg/dL
HGB URINE DIPSTICK: NEGATIVE
Ketones, ur: NEGATIVE mg/dL
NITRITE: NEGATIVE
Protein, ur: NEGATIVE mg/dL
SPECIFIC GRAVITY, URINE: 1.008 (ref 1.005–1.030)
pH: 6 (ref 5.0–8.0)

## 2015-01-18 LAB — TROPONIN I

## 2015-01-18 LAB — LIPASE, BLOOD: LIPASE: 29 U/L (ref 11–51)

## 2015-01-18 MED ORDER — ONDANSETRON HCL 4 MG/2ML IJ SOLN: 4.0000 mg | Freq: Four times a day (QID) | INTRAMUSCULAR | Status: DC | PRN

## 2015-01-18 MED ORDER — ONDANSETRON HCL 4 MG PO TABS: 4.0000 mg | ORAL_TABLET | Freq: Every day | ORAL | Status: DC | PRN

## 2015-01-18 MED ORDER — ACETAMINOPHEN 650 MG RE SUPP: 650.0000 mg | Freq: Four times a day (QID) | RECTAL | Status: DC | PRN

## 2015-01-18 MED ORDER — SODIUM CHLORIDE 0.9 % IV SOLN
INTRAVENOUS | Status: DC
  Administered 2015-01-19 (×3): via INTRAVENOUS

## 2015-01-18 MED ORDER — IOHEXOL 240 MG/ML SOLN
25.0000 mL | Freq: Once | INTRAMUSCULAR | Status: DC | PRN
Start: 2015-01-18 — End: 2015-01-20

## 2015-01-18 MED ORDER — LOPERAMIDE HCL 2 MG PO CAPS
ORAL_CAPSULE | ORAL | Status: AC
  Filled 2015-01-18: qty 1

## 2015-01-18 MED ORDER — ONDANSETRON HCL 4 MG PO TABS: 4.0000 mg | ORAL_TABLET | Freq: Four times a day (QID) | ORAL | Status: DC | PRN

## 2015-01-18 MED ORDER — PANTOPRAZOLE SODIUM 40 MG PO TBEC
40.0000 mg | DELAYED_RELEASE_TABLET | Freq: Two times a day (BID) | ORAL | Status: DC
  Filled 2015-01-18: qty 1

## 2015-01-18 MED ORDER — SODIUM CHLORIDE 0.9 % IJ SOLN
3.0000 mL | Freq: Two times a day (BID) | INTRAMUSCULAR | Status: DC
  Administered 2015-01-19 – 2015-01-20 (×3): 3 mL via INTRAVENOUS

## 2015-01-18 MED ORDER — SODIUM CHLORIDE 0.9 % IV BOLUS (SEPSIS)
1000.0000 mL | Freq: Once | INTRAVENOUS | Status: AC
  Administered 2015-01-18: 1000 mL via INTRAVENOUS

## 2015-01-18 MED ORDER — HYDRALAZINE HCL 20 MG/ML IJ SOLN: 10.0000 mg | INTRAMUSCULAR | Status: DC | PRN

## 2015-01-18 MED ORDER — OXYCODONE HCL 5 MG PO TABS: 5.0000 mg | ORAL_TABLET | ORAL | Status: DC | PRN

## 2015-01-18 MED ORDER — PANTOPRAZOLE SODIUM 40 MG IV SOLR
40.0000 mg | Freq: Once | INTRAVENOUS | Status: AC
  Administered 2015-01-18: 40 mg via INTRAVENOUS
  Filled 2015-01-18: qty 40

## 2015-01-18 MED ORDER — ACETAMINOPHEN 325 MG PO TABS: 650.0000 mg | ORAL_TABLET | Freq: Four times a day (QID) | ORAL | Status: DC | PRN

## 2015-01-18 MED ORDER — IOHEXOL 300 MG/ML  SOLN
100.0000 mL | Freq: Once | INTRAMUSCULAR | Status: AC | PRN
  Administered 2015-01-18: 100 mL via INTRAVENOUS

## 2015-01-18 MED ORDER — MORPHINE SULFATE (PF) 2 MG/ML IV SOLN
2.0000 mg | INTRAVENOUS | Status: DC | PRN
Start: 2015-01-18 — End: 2015-01-19

## 2015-01-18 NOTE — H&P (Signed)
[facility] - [place] [facility]   PATIENT NAME: Annette Crawford    MR#:  [id number]  DATE OF BIRTH:  Jun 15, 1934   DATE OF ADMISSION:  01/18/2015  PRIMARY CARE PHYSICIAN: Edwena Felty, MD   REQUESTING/REFERRING PHYSICIAN: McShane  CHIEF COMPLAINT:  Abdominal pain, nausea, left arm weakness   HISTORY OF PRESENT ILLNESS:  Annette Crawford  is a 80 y.o. female with a known history of CVA with residual left-sided weakness type 2 diabetes non-insulin-requiring hypertension essential who is presenting with abdominal pain and nausea as well as worsening left-sided weakness. History obtained through video interpreter. The patient and family described 2 day duration of symptoms including nausea, vomiting of nonbloody nonbilious emesis, associated epigastric pain-unable to further quantify/qualify pain, poor by mouth intake, as well as worsening left-sided weakness. Of note she did have 1 episode of diaphoresis which sounds like a hypoglycemic episode as she continued to take her medications without eating those symptoms for several resolved. She presented to Hospital now for worsening symptoms.  PAST MEDICAL HISTORY:   Past Medical History  Diagnosis Date  . Diabetes mellitus without complication (HCC)   . Hypertension   . Stroke (HCC)   . Hyperlipidemia     PAST SURGICAL HISTORY:  History reviewed. No pertinent past surgical history.  SOCIAL HISTORY:   Social History  Substance Use Topics  . Smoking status: Never Smoker   . Smokeless tobacco: Not on file  . Alcohol Use: No    FAMILY HISTORY:   Family History  Problem Relation Age of Onset  . Hyperlipidemia Mother   . Hypertension Mother   . Hyperlipidemia Father   . Hypertension Father     DRUG ALLERGIES:  No Known Allergies  REVIEW OF SYSTEMS:  REVIEW OF SYSTEMS:  CONSTITUTIONAL: Denies fevers, chills, positive fatigue, weakness.  EYES: Denies blurred vision, double vision, or  eye pain.  EARS, NOSE, THROAT: Denies tinnitus, ear pain, hearing loss.  RESPIRATORY: denies cough, shortness of breath, wheezing  CARDIOVASCULAR: Denies chest pain, palpitations, edema.  GASTROINTESTINAL: Positive nausea, vomiting, abdominal pain.  denies diarrhea GENITOURINARY: Denies dysuria, hematuria.  ENDOCRINE: Denies nocturia or thyroid problems. HEMATOLOGIC AND LYMPHATIC: Denies easy bruising or bleeding.  SKIN: Denies rash or lesions.  MUSCULOSKELETAL: Denies pain in neck, back, shoulder, knees, hips, or further arthritic symptoms.  NEUROLOGIC: Denies paralysis, paresthesias.  positive left-sided weakness  PSYCHIATRIC: Denies anxiety or depressive symptoms. Otherwise full review of systems performed by me is negative.   MEDICATIONS AT HOME:   Prior to Admission medications   Medication Sig Start Date End Date Taking? Authorizing Provider  atorvastatin (LIPITOR) 10 MG tablet Take 1 tablet (10 mg total) by mouth daily. 12/16/14 12/16/15 Yes Edwena Felty, MD  glimepiride (AMARYL) 2 MG tablet Take 1 tablet (2 mg total) by mouth 3 (three) times daily. 12/16/14 12/16/15 Yes Edwena Felty, MD  lisinopril-hydrochlorothiazide (ZESTORETIC) 20-12.5 MG tablet Take 1 tablet by mouth 2 (two) times daily. 12/16/14 12/16/15 Yes Edwena Felty, MD  metFORMIN (GLUCOPHAGE) 500 MG tablet Take 1 tablet (500 mg total) by mouth 3 (three) times daily. 12/16/14  Yes Edwena Felty, MD  ondansetron (ZOFRAN) 4 MG tablet Take 1 tablet (4 mg total) by mouth daily as needed for nausea or vomiting. 01/18/15   Jeanmarie Plant, MD      VITAL SIGNS:  Blood pressure 133/62, pulse 66, temperature 99.1 F (37.3 C), temperature source Oral, resp. rate 16, height  (1.676 m), weight 140 lb (  63.504 kg), SpO2 98 %.  PHYSICAL EXAMINATION:  VITAL SIGNS: Filed Vitals:   01/18/15 2210 01/18/15 2212  BP: 133/62   Pulse:  66  Temp:    Resp:  16   GENERAL:80 y.o.female currently in no acute distress.   HEAD: Normocephalic, atraumatic.  EYES: Pupils equal, round, reactive to light. Extraocular muscles intact. No scleral icterus.  MOUTH: Moist mucosal membrane. Dentition intact. No abscess noted.  EAR, NOSE, THROAT: Clear without exudates. No external lesions.  NECK: Supple. No thyromegaly. No nodules. No JVD.  PULMONARY: Clear to ascultation, without wheeze rails or rhonci. No use of accessory muscles, Good respiratory effort. good air entry bilaterally CHEST: Nontender to palpation.  CARDIOVASCULAR: S1 and S2. Regular rate and rhythm. No murmurs, rubs, or gallops. No edema. Pedal pulses 2+ bilaterally.  GASTROINTESTINAL: Soft, nontender, nondistended. No masses. Positive bowel sounds. No hepatosplenomegaly.  MUSCULOSKELETAL: No swelling, clubbing, or edema. Range of motion full in all extremities.  NEUROLOGIC:  left-sided facial droop, remainder of Cranial nerves II through XII are intact.left upper extremity strength 3/5 proximal distal flexion and extension left lower extremity 4/5 all other extremities 5/5   left-sided pronator drift positive Sensation intact. Reflexes intact.  SKIN: No ulceration, lesions, rashes, or cyanosis. Skin warm and dry. Turgor intact.  PSYCHIATRIC: Mood, affect within normal limits. The patient is awake, alert and oriented x 3. Insight, judgment intact.    LABORATORY PANEL:   CBC  Recent Labs Lab 01/18/15 1341  WBC 5.1  HGB 11.8*  HCT 35.6  PLT 142*   ------------------------------------------------------------------------------------------------------------------  Chemistries   Recent Labs Lab 01/18/15 1341 01/18/15 1345  NA 138  --   K 4.3  --   CL 105  --   CO2 26  --   GLUCOSE 95  --   BUN 24*  --   CREATININE 0.84  --   CALCIUM 8.9  --   AST  --  19  ALT  --  11*  ALKPHOS  --  56  BILITOT  --  0.9   ------------------------------------------------------------------------------------------------------------------  Cardiac  Enzymes  Recent Labs Lab 01/18/15 1825  TROPONINI <0.03   ------------------------------------------------------------------------------------------------------------------  RADIOLOGY:  Dg Chest 2 View  01/18/2015  CLINICAL DATA:  Vomiting, weakness and chest pain 1 week. Shortness of breath since yesterday. EXAM: CHEST  2 VIEW COMPARISON:  04/02/2014 and 11/21/2011 FINDINGS: Patient slightly rotated to the left. Lungs are hypoinflated without focal consolidation or effusion. There is subtle prominence of the perihilar markings which may be due to minimal vascular congestion versus acute bronchitic process. Cardiomediastinal silhouette is within normal. There is calcified plaque over the aortic arch. There are mild degenerative changes of the spine. IMPRESSION: Subtle prominence of the central perihilar markings which may be due to mild vascular congestion versus an acute bronchitic process. Electronically Signed   By: Elberta Fortis M.D.   On: 01/18/2015 14:05   Ct Head Wo Contrast  01/18/2015  CLINICAL DATA:  80 year old female with vomiting, weakness, chest pain and shortness of breath EXAM: CT HEAD WITHOUT CONTRAST TECHNIQUE: Contiguous axial images were obtained from the base of the skull through the vertex without intravenous contrast. COMPARISON:  Prior head CT 06/01/2014 FINDINGS: Negative for acute intracranial hemorrhage, acute infarction, mass, mass effect, hydrocephalus or midline shift. Gray-white differentiation is preserved throughout. Stable remote right posterior MCA territory infarct with resultant encephalomalacia. Stable chronic cerebral cortical atrophy and mild chronic microvascular ischemic white matter disease including bilateral basal ganglia lacunar infarcts. Up no focal  soft tissue or calvarial abnormality. Symmetric globes and orbits bilaterally. Normal aeration of the mastoid air cells and paranasal sinuses. Atherosclerotic calcifications of the bilateral cavernous and  supraclinoid carotid arteries. Mild hyperostosis frontalis interna. IMPRESSION: 1. No acute intracranial abnormality. 2. Stable appearance of remote right posterior MCA territory infarct. Electronically Signed   By: Malachy MoanHeath  McCullough M.D.   On: 01/18/2015 15:51   Ct Abdomen Pelvis W Contrast  01/18/2015  CLINICAL DATA:  Patient has had vomiting for two days. She has had constipation. She denies abdominal pain. EXAM: CT ABDOMEN AND PELVIS WITH CONTRAST TECHNIQUE: Multidetector CT imaging of the abdomen and pelvis was performed using the standard protocol following bolus administration of intravenous contrast. CONTRAST:  100mL OMNIPAQUE IOHEXOL 300 MG/ML  SOLN COMPARISON:  06/01/2014 FINDINGS: Lower chest:  Mild bibasilar atelectasis.  Mild cardiac enlargement. Hepatobiliary: Very mild hepatic steatosis. Pancreas: Normal Spleen: Normal Adrenals/Urinary Tract: Adrenal glands are normal. Small low-attenuation renal lesions bilaterally too small to characterize a likely cysts. Both ureters mildly prominent likely related to bladder distention. Nonobstructing 3 mm stone midpole right kidney. Stomach/Bowel: Distal esophageal wall thickening. Small hiatal hernia. Small bowel is normal. Appendix is normal. Large bowel normal. Nonobstructive bowel gas pattern. Fecal retention is not particularly prominent. Vascular/Lymphatic: No acute findings Reproductive: Negative Other: No ascites Musculoskeletal: No acute abnormalities IMPRESSION: Distal esophageal wall thickening suggests esophagitis. There are otherwise no acute findings. Electronically Signed   By: Esperanza Heiraymond  Rubner M.D.   On: 01/18/2015 20:06   Dg Abd 2 Views  01/18/2015  CLINICAL DATA:  Decreased appetite for 1 week, abdominal pain starting yesterday EXAM: ABDOMEN - 2 VIEW COMPARISON:  06/01/2014 FINDINGS: Degenerative changes lumbar spine L4-L5 level. Minimal gaseous distended small bowel loops mid abdomen. Mild ileus or enteritis cannot be excluded. No small  bowel air-fluid levels. No free abdominal air. IMPRESSION: Mild gaseous distended small bowel loops mid abdomen. Ileus or enteritis cannot be excluded. No small bowel air-fluid levels. No free abdominal air. Electronically Signed   By: Natasha MeadLiviu  Pop M.D.   On: 01/18/2015 15:59    EKG:   Orders placed or performed during the hospital encounter of 01/18/15  . EKG 12-Lead  . EKG 12-Lead  . ED EKG within 10 minutes  . ED EKG within 10 minutes  . EKG 12-Lead  . EKG 12-Lead    IMPRESSION AND PLAN:   80 year old Arabic female who is presenting with nausea vomiting and worsening weakness  1. TIA: Aspirin statin therapy, place on telemetry we'll check MRI hemoglobin A1c, lipid panel echocardiogram carotid Dopplers  In searching for risk factor modification  2.  nausea/vomiting/abdominal pain: Esophagitis, PPI therapy 3. Essential hypertension: Permissive hypertension will hold medications 4. Type 2 diabetes non-insulin-requiring hold oral agents at insulin sliding scale 5. Venous thromboembolism prophylactic: SCDs  the records are reviewed and case discussed with ED provider. Management plans discussed with the patient, family and they are in agreement.  CODE STATUfullOTAL TIME TAKING CARE OF THIS PATIENT: 45minutes.    Tawna Alwin,  Mardi MainlandDavid K M.D on 01/18/2015 at 10:31 PM  Between 7am to 6pm - Pager - [phone]  After 6pm: House Pager: - [phone]  [facility]  Office  [phone]  CC: Primary care physician; Edwena FeltyAshany Sundaram, MD

## 2015-01-18 NOTE — ED Notes (Signed)
Pt placed on bedpan for urination.

## 2015-01-18 NOTE — ED Notes (Signed)
Pt taken to xray 

## 2015-01-18 NOTE — ED Notes (Signed)
Pt given cracker and water for PO challenge.

## 2015-01-18 NOTE — ED Notes (Signed)
Patient transported from CT back to room 19

## 2015-01-18 NOTE — ED Notes (Signed)
Pt returned from xray

## 2015-01-18 NOTE — ED Notes (Signed)
Attempted to call report, RN unavailable at this time.

## 2015-01-18 NOTE — ED Notes (Signed)
MD at bedside. Dr Hower 

## 2015-01-18 NOTE — ED Notes (Signed)
Son, Environmental consultantnterpreter online, with Dr Alphonzo LemmingsMcShane and myself.  Pts' son very insistent on pt being admitted.   Dr Alphonzo LemmingsMcShane informed family that all tests were negative.  Pts' son beginning to be agitated about no rationale for admission.  Dr Alphonzo LemmingsMcShane spoke with Hospitalist to admit, await admission orders and bed assignment.

## 2015-01-18 NOTE — ED Notes (Signed)
Pt arrives via EMS, states vomiting, weakness, and chest pain, SOB since yesterday, pt does not speak english, arabic interpeter [phone], pt awake upon arrival, states chest pain comes and goes, hx of CVA with left arm weakness

## 2015-01-18 NOTE — ED Notes (Signed)
Pt tolerated PO intake

## 2015-01-18 NOTE — ED Notes (Signed)
EMS gave 324 ASA and 1 inch nitro paste

## 2015-01-18 NOTE — ED Provider Notes (Addendum)
[facility] Emergency Department Provider Note  ____________________________________________   I have reviewed the triage vital signs and the nursing notes.   HISTORY  Chief Complaint Chest Pain; Emesis; and Weakness    HPI Annette Crawford is a 80 y.o. female with a history of oral-controlled diabetes and hypertension presents with vomiting for 2 days. She has had diffuse mild abdominal discomfort especially in the epigastric region. She denies chest pain initially but states that when she is in the act of vomiting and is uncomfortable. She is a history of CVA apparently and the last time she had a vomiting episode it turned out that they've family was told that might of been a stroke. She has not had a headache or hit her head. The patient denies focal neurologic deficit. She has some pain in her left arm for some time which she believes is arthritic. All history is somewhat limited as it is via interpreter telephone..She denies diarrhea or fever. She's not sure the last time she had a bowel movement was. She has a long history of left upper extremity paresthesias and shooting pains she states which is not different from prior.  Past Medical History  Diagnosis Date  . Diabetes mellitus without complication (HCC)   . Hypertension   . Stroke (HCC)   . Hyperlipidemia     Patient Active Problem List   Diagnosis Date Noted  . History of CVA (cerebrovascular accident) without residual deficits 09/18/2014  . Paresthesia of left upper extremity 09/18/2014  . Controlled type 2 DM with peripheral circulatory disorder (HCC) 09/18/2014  . Hyperlipidemia LDL goal <70 09/18/2014  . Hypertension goal BP (blood pressure) < 140/90 09/18/2014    History reviewed. No pertinent past surgical history.  Current Outpatient Rx  Name  Route  Sig  Dispense  Refill  . atorvastatin (LIPITOR) 10 MG tablet   Oral   Take 1 tablet (10 mg total) by mouth daily.   90 tablet    2   . glimepiride (AMARYL) 2 MG tablet   Oral   Take 1 tablet (2 mg total) by mouth 3 (three) times daily.   270 tablet   2   . lisinopril-hydrochlorothiazide (ZESTORETIC) 20-12.5 MG tablet   Oral   Take 1 tablet by mouth 2 (two) times daily.   180 tablet   2   . metFORMIN (GLUCOPHAGE) 500 MG tablet   Oral   Take 1 tablet (500 mg total) by mouth 3 (three) times daily.   270 tablet   2     Allergies Review of patient's allergies indicates no known allergies.  Family History  Problem Relation Age of Onset  . Hyperlipidemia Mother   . Hypertension Mother   . Hyperlipidemia Father   . Hypertension Father     Social History Social History  Substance Use Topics  . Smoking status: Never Smoker   . Smokeless tobacco: None  . Alcohol Use: No    Review of Systems Constitutional: No fever/chills Eyes: No visual changes. ENT: No sore throat. No stiff neck no neck pain Cardiovascular: Denies chest pain. Respiratory: Denies shortness of breath. Gastrointestinal:   Positive vomiting.  No diarrhea.  No constipation. Genitourinary: Negative for dysuria. Musculoskeletal: Negative lower extremity swelling Skin: Negative for rash. Neurological: Negative for headaches, focal weakness or numbness. 10-point ROS otherwise negative.  ____________________________________________   PHYSICAL EXAM:  VITAL SIGNS: ED Triage Vitals  Enc Vitals Group     BP 01/18/15 1335 177/69 mmHg  Pulse Rate 01/18/15 1335 89     Resp 01/18/15 1335 18     Temp 01/18/15 1335 99.1 F (37.3 C)     Temp Source 01/18/15 1335 Oral     SpO2 01/18/15 1345 98 %     Weight 01/18/15 1335 140 lb (63.504 kg)     Height 01/18/15 1335 5\' 6"  (1.676 m)     Head Cir --      Peak Flow --      Pain Score --      Pain Loc --      Pain Edu? --      Excl. in GC? --     Constitutional: Alert and oriented. Well appearing and in no acute distress. Eyes: Conjunctivae are normal. PERRL. EOMI. Head:  Atraumatic. Nose: No congestion/rhinnorhea. Mouth/Throat: Mucous membranes are moist.  Oropharynx non-erythematous. Neck: No stridor.   Nontender with no meningismus Cardiovascular: Normal rate, regular rhythm. Grossly normal heart sounds.  Good peripheral circulation. Respiratory: Normal respiratory effort.  No retractions. Lungs CTAB. Abdominal: Had epigastric tenderness. No distention. No guarding no rebound Back:  There is no focal tenderness or step off there is no midline tenderness there are no lesions noted. there is no CVA tenderness Musculoskeletal: No lower extremity tenderness. No joint effusions, no DVT signs strong distal pulses no edema Neurologic:  Normal speech and language Patient states it hurts to grip strength because of her history of nephropathy that arm. In any event it is difficult to further determine whether or not she is equally stronger because of effort. Strong pulses noted in that extremity. Otherwise no focal neurologic deficit noted, cranial nerves II through XII are grossly intact she has decreased grip strength as noted on the left versus the right, she has intact strength in the lower extremity. Nose within normal limits speech is apparently normal to the extent that I can determine. Skin:  Skin is warm, dry and intact. No rash noted. Psychiatric: Mood and affect are normal. Speech and behavior are normal.  ____________________________________________   LABS (all labs ordered are listed, but only abnormal results are displayed)  Labs Reviewed  BASIC METABOLIC PANEL - Abnormal; Notable for the following:    BUN 24 (*)    All other components within normal limits  CBC - Abnormal; Notable for the following:    Hemoglobin 11.8 (*)    Platelets 142 (*)    All other components within normal limits  HEPATIC FUNCTION PANEL - Abnormal; Notable for the following:    ALT 11 (*)    All other components within normal limits  TROPONIN I  LIPASE, BLOOD  TROPONIN I    ____________________________________________  EKG  I personally interpreted any EKGs ordered by me or triage Sinus rhythm rate 69 bpm no acute ST elevation or acute ST depression normal axis ____________________________________________  RADIOLOGY  I reviewed any imaging ordered by me or triage that were performed during my shift ____________________________________________   PROCEDURES  Procedure(s) performed: None  Critical Care performed: None  ____________________________________________   INITIAL IMPRESSION / ASSESSMENT AND PLAN / ED COURSE  Pertinent labs & imaging results that were available during my care of the patient were reviewed by me and considered in my medical decision making (see chart for details).  Patient here with vomiting. Very difficult to get a history and physical from the patient even though there is an interpreter machine and family present. She is very opaque in her answers. However she does have minimal epigastric  pain. Because of the history of CVA so she was vomiting the past and it is CT scan which is negative and given that she has had symptoms since Sunday I have low suspicion this is a CVA. Certainly no evidence of head bleed. Patient has no headache. Given her vomiting which can be a sign of cardiac disease absent cardiac enzymes which is negative EKG is reassuring repeat troponin is pending, liver function tests are normal lipase is reassuring. She feels much better after IV fluids is tolerating by mouth. However she still complains of epigastric abdominal pain. Patient did as noted have some left-sided upper extremity weakness, but it is very difficult to tell how old that is. Certainly has been going on for at least 3 days, patient is not a candidate for TPA ; patient seems to indicate this is been present since her last stroke although again it is very unclear. ----------------------------------------- 8:17 PM on  01/18/2015 ----------------------------------------- Patient and family state that she has had some residual weakness in the left upper extremity since her last CVA and they now state that it is weaker over the last 3 days. I discussed with them the options of admission or discharge and there angry and adamant that she must be admitted to the hospital for further evaluation of her vomiting and her generalized feeling of weakness as well as the questionable increase in weakness in the left upper extremity. It is unclear why, if the family thought she was having a stroke, they waited 3 days to bring her in. However, again some of this is lost in translation.  ----------------------------------------- 10:08 PM on 01/18/2015 -----------------------------------------  The patient has been signed out to the hospitalist service who will admit. Oncoming ER doctor will watch for any decompensation  ____________________________________________   FINAL CLINICAL IMPRESSION(S) / ED DIAGNOSES  Final diagnoses:  Pain      Jeanmarie Plant, MD 01/18/15 2104  Jeanmarie Plant, MD 01/18/15 2128  Jeanmarie Plant, MD 01/18/15 1610  Jeanmarie Plant, MD 01/18/15 2208

## 2015-01-18 NOTE — ED Notes (Signed)
Patient transported to CT 

## 2015-01-19 ENCOUNTER — Observation Stay: Admit: 2015-01-19 | Payer: Medicaid Other

## 2015-01-19 ENCOUNTER — Observation Stay
Admit: 2015-01-19 | Discharge: 2015-01-19 | Disposition: A | Discharge status: Home / Self Care | Payer: Medicaid Other | Attending: Internal Medicine | Admitting: Internal Medicine

## 2015-01-19 DIAGNOSIS — F0630 Mood disorder due to known physiological condition, unspecified: Secondary | ICD-10-CM | POA: Diagnosis not present

## 2015-01-19 LAB — BASIC METABOLIC PANEL
ANION GAP: 6 (ref 5–15)
BUN: 19 mg/dL (ref 6–20)
CALCIUM: 8.7 mg/dL — AB (ref 8.9–10.3)
CO2: 26 mmol/L (ref 22–32)
CREATININE: 0.79 mg/dL (ref 0.44–1.00)
Chloride: 109 mmol/L (ref 101–111)
GLUCOSE: 106 mg/dL — AB (ref 65–99)
Potassium: 4 mmol/L (ref 3.5–5.1)
Sodium: 141 mmol/L (ref 135–145)

## 2015-01-19 LAB — GLUCOSE, CAPILLARY
GLUCOSE-CAPILLARY: 101 mg/dL — AB (ref 65–99)
GLUCOSE-CAPILLARY: 162 mg/dL — AB (ref 65–99)
Glucose-Capillary: 105 mg/dL — ABNORMAL HIGH (ref 65–99)
Glucose-Capillary: 345 mg/dL — ABNORMAL HIGH (ref 65–99)
Glucose-Capillary: 136 mg/dL — ABNORMAL HIGH (ref 65–99)

## 2015-01-19 LAB — CBC
HCT: 33.1 % — ABNORMAL LOW (ref 35.0–47.0)
HEMOGLOBIN: 11.2 g/dL — AB (ref 12.0–16.0)
MCH: 29.1 pg (ref 26.0–34.0)
MCHC: 33.9 g/dL (ref 32.0–36.0)
MCV: 85.9 fL (ref 80.0–100.0)
Platelets: 134 10*3/uL — ABNORMAL LOW (ref 150–440)
RBC: 3.85 MIL/uL (ref 3.80–5.20)
RDW: 13.6 % (ref 11.5–14.5)
WBC: 6.2 10*3/uL (ref 3.6–11.0)

## 2015-01-19 LAB — LIPID PANEL
CHOL/HDL RATIO: 4.5 ratio
Cholesterol: 138 mg/dL (ref 0–200)
HDL: 31 mg/dL — ABNORMAL LOW (ref >40)
LDL CALC: 85 mg/dL (ref 0–99)
Triglycerides: 111 mg/dL (ref <150)
VLDL: 22 mg/dL (ref 0–40)

## 2015-01-19 LAB — HEMOGLOBIN A1C: Hgb A1c MFr Bld: 5.3 % (ref 4.0–6.0)

## 2015-01-19 MED ORDER — ASPIRIN 325 MG PO TABS
325.0000 mg | ORAL_TABLET | Freq: Every day | ORAL | Status: DC
  Administered 2015-01-19 – 2015-01-20 (×2): 325 mg via ORAL
  Filled 2015-01-19 (×2): qty 1

## 2015-01-19 MED ORDER — MIRTAZAPINE 15 MG PO TABS
7.5000 mg | ORAL_TABLET | Freq: Every day | ORAL | Status: DC
  Administered 2015-01-19: 7.5 mg via ORAL
  Filled 2015-01-19: qty 1

## 2015-01-19 MED ORDER — STROKE: EARLY STAGES OF RECOVERY BOOK
Freq: Once | Status: AC
  Administered 2015-01-19: 02:00:00

## 2015-01-19 MED ORDER — PNEUMOCOCCAL VAC POLYVALENT 25 MCG/0.5ML IJ INJ: 0.5000 mL | INJECTION | INTRAMUSCULAR | Status: DC

## 2015-01-19 MED ORDER — ATORVASTATIN CALCIUM 10 MG PO TABS: 10.0000 mg | ORAL_TABLET | Freq: Every day | ORAL | Status: DC

## 2015-01-19 MED ORDER — ATORVASTATIN CALCIUM 20 MG PO TABS
40.0000 mg | ORAL_TABLET | Freq: Every day | ORAL | Status: DC
  Administered 2015-01-19: 23:00:00 40 mg via ORAL
  Filled 2015-01-19: qty 2

## 2015-01-19 MED ORDER — INFLUENZA VAC SPLIT QUAD 0.5 ML IM SUSY: 0.5000 mL | PREFILLED_SYRINGE | INTRAMUSCULAR | Status: DC

## 2015-01-19 MED ORDER — GLIMEPIRIDE 2 MG PO TABS
2.0000 mg | ORAL_TABLET | Freq: Three times a day (TID) | ORAL | Status: DC
  Administered 2015-01-19 – 2015-01-20 (×2): 2 mg via ORAL
  Filled 2015-01-19 (×4): qty 1

## 2015-01-19 MED ORDER — ASPIRIN 300 MG RE SUPP: 300.0000 mg | Freq: Every day | RECTAL | Status: DC

## 2015-01-19 MED ORDER — FAMOTIDINE 20 MG PO TABS
20.0000 mg | ORAL_TABLET | Freq: Two times a day (BID) | ORAL | Status: DC
  Administered 2015-01-19: 23:00:00 20 mg via ORAL
  Filled 2015-01-19 (×2): qty 1

## 2015-01-19 MED ORDER — INSULIN ASPART 100 UNIT/ML ~~LOC~~ SOLN
0.0000 [IU] | Freq: Three times a day (TID) | SUBCUTANEOUS | Status: DC
  Filled 2015-01-19: qty 7
  Filled 2015-01-19: qty 3

## 2015-01-19 MED ORDER — INSULIN ASPART 100 UNIT/ML ~~LOC~~ SOLN: 0.0000 [IU] | Freq: Every day | SUBCUTANEOUS | Status: DC

## 2015-01-19 NOTE — Progress Notes (Signed)
*  PRELIMINARY RESULTS* Echocardiogram 2D Echocardiogram has been performed.  Annette Crawford 01/19/2015, 8:47 PM

## 2015-01-19 NOTE — BH Assessment (Signed)
Writer informed Psychiatrist (Dr. Faheem) of consult 

## 2015-01-19 NOTE — Consult Note (Signed)
[facility] Consult   Reason for Consult:  Depression Referring Physician:  Shanon Brow Hover,M.D Patient Identification: Annette Crawford MRN:  [id number] Principal Diagnosis: TIA (transient ischemic attack) Diagnosis:   Patient Active Problem List   Diagnosis Date Noted  . TIA (transient ischemic attack) [G45.9] 01/18/2015  . History of CVA (cerebrovascular accident) without residual deficits [Z86.73] 09/18/2014  . Paresthesia of left upper extremity [R20.2] 09/18/2014  . Controlled type 2 DM with peripheral circulatory disorder ([place]) [E11.51] 09/18/2014  . Hyperlipidemia LDL goal <70 [E78.5] 09/18/2014  . Hypertension goal BP (blood pressure) < 140/90 [I10] 09/18/2014    Total Time spent with patient: 1 hour  Subjective:   Annette Crawford is a 80 y.o. female patient with a known history of CVA with residual left-sided weakness type 2 diabetes non-insulin-requiring hypertension essential presented  with abdominal pain and nausea as well as worsening left-sided weakness. History obtained through  Interpreter as pt does not speak [place].    HPI:   According to the initial records, The patient and family described 2 day duration of symptoms including nausea, vomiting of nonbloody nonbilious emesis, associated epigastric pain-unable to further quantify/qualify pain, poor by mouth intake, as well as worsening left-sided weakness.  She presented to Hospital now for worsening symptoms  During my interview, she stated that she has stroke like symptoms and continues to have weakness in her hands. She stated that she is not able to sleep well at night. She denied feeling depressed and denied suicidal ideations or plans. No perceptual disturbances noted.    Past Psychiatric History: Denied previous psychiatric history.   Risk to Self: Is patient at risk for suicide?: No Risk to Others:   Prior Inpatient Therapy:   Prior Outpatient Therapy:    Past Medical  History:  Past Medical History  Diagnosis Date  . Diabetes mellitus without complication ([facility])   . Hypertension   . Stroke ([place])   . Hyperlipidemia    History reviewed. No pertinent past surgical history. Family History:  Family History  Problem Relation Age of Onset  . Hyperlipidemia Mother   . Hypertension Mother   . Hyperlipidemia Father   . Hypertension Father    Family Psychiatric  History:None reported Social History:  History  Alcohol Use No     History  Drug Use No    Social History   Social History  . Marital Status: Married    Spouse Name: N/A  . Number of Children: N/A  . Years of Education: N/A   Social History Main Topics  . Smoking status: Never Smoker   . Smokeless tobacco: None  . Alcohol Use: No  . Drug Use: No  . Sexual Activity: No   Other Topics Concern  . None   Social History Narrative   Additional Social History:        Moved to [place] 1 year ago. She is originally from [place], [place]. Lives with her sons and their families.                   Allergies:  No Known Allergies  Labs:  Results for orders placed or performed during the hospital encounter of 01/18/15 (from the past 48 hour(s))  Basic metabolic panel     Status: Abnormal   Collection Time: 01/18/15  1:41 PM  Result Value Ref Range   Sodium 138 135 - 145 mmol/L   Potassium 4.3 3.5 - 5.1 mmol/L    Comment: HEMOLYSIS AT THIS  LEVEL MAY AFFECT RESULT   Chloride 105 101 - 111 mmol/L   CO2 26 22 - 32 mmol/L   Glucose, Bld 95 65 - 99 mg/dL   BUN 24 (H) 6 - 20 mg/dL   Creatinine, Ser 0.84 0.44 - 1.00 mg/dL   Calcium 8.9 8.9 - 10.3 mg/dL   GFR calc non Af Amer >60 >60 mL/min   GFR calc Af Amer >60 >60 mL/min    Comment: (NOTE) The eGFR has been calculated using the CKD EPI equation. This calculation has not been validated in all clinical situations. eGFR's persistently <60 mL/min signify possible Chronic Kidney Disease.    Anion gap 7 5 - 15  CBC     Status:  Abnormal   Collection Time: 01/18/15  1:41 PM  Result Value Ref Range   WBC 5.1 3.6 - 11.0 K/uL   RBC 4.14 3.80 - 5.20 MIL/uL   Hemoglobin 11.8 (L) 12.0 - 16.0 g/dL   HCT 35.6 35.0 - 47.0 %   MCV 86.1 80.0 - 100.0 fL   MCH 28.4 26.0 - 34.0 pg   MCHC 33.0 32.0 - 36.0 g/dL   RDW 13.6 11.5 - 14.5 %   Platelets 142 (L) 150 - 440 K/uL  Troponin I     Status: None   Collection Time: 01/18/15  1:41 PM  Result Value Ref Range   Troponin I <0.03 <0.031 ng/mL    Comment:        NO INDICATION OF MYOCARDIAL INJURY.   Lipase, blood     Status: None   Collection Time: 01/18/15  1:45 PM  Result Value Ref Range   Lipase 29 11 - 51 U/L  Hepatic function panel     Status: Abnormal   Collection Time: 01/18/15  1:45 PM  Result Value Ref Range   Total Protein 6.7 6.5 - 8.1 g/dL   Albumin 4.0 3.5 - 5.0 g/dL   AST 19 15 - 41 U/L    Comment: HEMOLYSIS AT THIS LEVEL MAY AFFECT RESULT   ALT 11 (L) 14 - 54 U/L   Alkaline Phosphatase 56 38 - 126 U/L   Total Bilirubin 0.9 0.3 - 1.2 mg/dL    Comment: HEMOLYSIS AT THIS LEVEL MAY AFFECT RESULT   Bilirubin, Direct 0.3 0.1 - 0.5 mg/dL    Comment: HEMOLYSIS AT THIS LEVEL MAY AFFECT RESULT   Indirect Bilirubin 0.6 0.3 - 0.9 mg/dL  Troponin I     Status: None   Collection Time: 01/18/15  6:25 PM  Result Value Ref Range   Troponin I <0.03 <0.031 ng/mL    Comment:        NO INDICATION OF MYOCARDIAL INJURY.   Urinalysis complete, with microscopic     Status: Abnormal   Collection Time: 01/18/15  7:37 PM  Result Value Ref Range   Color, Urine STRAW (A) YELLOW   APPearance CLEAR (A) CLEAR   Glucose, UA NEGATIVE NEGATIVE mg/dL   Bilirubin Urine NEGATIVE NEGATIVE   Ketones, ur NEGATIVE NEGATIVE mg/dL   Specific Gravity, Urine 1.008 1.005 - 1.030   Hgb urine dipstick NEGATIVE NEGATIVE   pH 6.0 5.0 - 8.0   Protein, ur NEGATIVE NEGATIVE mg/dL   Nitrite NEGATIVE NEGATIVE   Leukocytes, UA 2+ (A) NEGATIVE   RBC / HPF 0-5 0 - 5 RBC/hpf   WBC, UA 0-5 0 - 5  WBC/hpf   Bacteria, UA NONE SEEN NONE SEEN   Squamous Epithelial / LPF 0-5 (A) NONE SEEN  Mucous PRESENT   Urine culture     Status: None (Preliminary result)   Collection Time: 01/18/15  7:37 PM  Result Value Ref Range   Specimen Description URINE, RANDOM    Special Requests NONE    Culture TOO YOUNG TO READ    Report Status PENDING   Glucose, capillary     Status: Abnormal   Collection Time: 01/19/15  2:11 AM  Result Value Ref Range   Glucose-Capillary 105 (H) 65 - 99 mg/dL   Comment 1 Notify RN   Basic metabolic panel     Status: Abnormal   Collection Time: 01/19/15  5:56 AM  Result Value Ref Range   Sodium 141 135 - 145 mmol/L   Potassium 4.0 3.5 - 5.1 mmol/L   Chloride 109 101 - 111 mmol/L   CO2 26 22 - 32 mmol/L   Glucose, Bld 106 (H) 65 - 99 mg/dL   BUN 19 6 - 20 mg/dL   Creatinine, Ser 0.79 0.44 - 1.00 mg/dL   Calcium 8.7 (L) 8.9 - 10.3 mg/dL   GFR calc non Af Amer >60 >60 mL/min   GFR calc Af Amer >60 >60 mL/min    Comment: (NOTE) The eGFR has been calculated using the CKD EPI equation. This calculation has not been validated in all clinical situations. eGFR's persistently <60 mL/min signify possible Chronic Kidney Disease.    Anion gap 6 5 - 15  CBC     Status: Abnormal   Collection Time: 01/19/15  5:56 AM  Result Value Ref Range   WBC 6.2 3.6 - 11.0 K/uL   RBC 3.85 3.80 - 5.20 MIL/uL   Hemoglobin 11.2 (L) 12.0 - 16.0 g/dL   HCT 33.1 (L) 35.0 - 47.0 %   MCV 85.9 80.0 - 100.0 fL   MCH 29.1 26.0 - 34.0 pg   MCHC 33.9 32.0 - 36.0 g/dL   RDW 13.6 11.5 - 14.5 %   Platelets 134 (L) 150 - 440 K/uL  Lipid panel     Status: Abnormal   Collection Time: 01/19/15  5:56 AM  Result Value Ref Range   Cholesterol 138 0 - 200 mg/dL   Triglycerides 111 <150 mg/dL   HDL 31 (L) >40 mg/dL   Total CHOL/HDL Ratio 4.5 RATIO   VLDL 22 0 - 40 mg/dL   LDL Cholesterol 85 0 - 99 mg/dL    Comment:        Total Cholesterol/HDL:CHD Risk Coronary Heart Disease Risk Table                      Men   Women  1/2 Average Risk   3.4   3.3  Average Risk       5.0   4.4  2 X Average Risk   9.6   7.1  3 X Average Risk  23.4   11.0        Use the calculated Patient Ratio above and the CHD Risk Table to determine the patient's CHD Risk.        ATP III CLASSIFICATION (LDL):  <100     mg/dL   Optimal  100-129  mg/dL   Near or Above                    Optimal  130-159  mg/dL   Borderline  160-189  mg/dL   High  >190     mg/dL   Very High   Glucose, capillary  Status: Abnormal   Collection Time: 01/19/15  7:37 AM  Result Value Ref Range   Glucose-Capillary 101 (H) 65 - 99 mg/dL   Comment 1 Notify RN   Glucose, capillary     Status: Abnormal   Collection Time: 01/19/15 11:15 AM  Result Value Ref Range   Glucose-Capillary 345 (H) 65 - 99 mg/dL   Comment 1 Notify RN     Current Facility-Administered Medications  Medication Dose Route Frequency Provider Last Rate Last Dose  . 0.9 %  sodium chloride infusion   Intravenous Continuous Lytle Butte, MD 100 mL/hr at 01/19/15 1134    . acetaminophen (TYLENOL) tablet 650 mg  650 mg Oral Q6H PRN Lytle Butte, MD       Or  . acetaminophen (TYLENOL) suppository 650 mg  650 mg Rectal Q6H PRN Lytle Butte, MD      . aspirin suppository 300 mg  300 mg Rectal Daily Lytle Butte, MD       Or  . aspirin tablet 325 mg  325 mg Oral Daily Lytle Butte, MD   325 mg at 01/19/15 1229  . atorvastatin (LIPITOR) tablet 10 mg  10 mg Oral QHS Lytle Butte, MD      . glimepiride (AMARYL) tablet 2 mg  2 mg Oral TID Bettey Costa, MD      . hydrALAZINE (APRESOLINE) injection 10 mg  10 mg Intravenous Q4H PRN Lytle Butte, MD      . Derrill Memo ON 01/20/2015] Influenza vac split quadrivalent PF (FLUARIX) injection 0.5 mL  0.5 mL Intramuscular Tomorrow-1000 Lytle Butte, MD      . insulin aspart (novoLOG) injection 0-5 Units  0-5 Units Subcutaneous QHS Lytle Butte, MD   0 Units at 01/19/15 4431  . insulin aspart (novoLOG) injection 0-9 Units   0-9 Units Subcutaneous TID WC Lytle Butte, MD   0 Units at 01/19/15 [phone]  . iohexol (OMNIPAQUE) 240 MG/ML injection 25 mL  25 mL Oral Once PRN Schuyler Amor, MD      . morphine 2 MG/ML injection 2 mg  2 mg Intravenous Q4H PRN Lytle Butte, MD      . ondansetron [facility]) tablet 4 mg  4 mg Oral Q6H PRN Lytle Butte, MD       Or  . ondansetron [facility]) injection 4 mg  4 mg Intravenous Q6H PRN Lytle Butte, MD      . oxyCODONE (Oxy IR/ROXICODONE) immediate release tablet 5 mg  5 mg Oral Q4H PRN Lytle Butte, MD      . pantoprazole (PROTONIX) EC tablet 40 mg  40 mg Oral BID Lytle Butte, MD   40 mg at 01/19/15 1000  . [START ON 01/20/2015] pneumococcal 23 valent vaccine (PNU-IMMUNE) injection 0.5 mL  0.5 mL Intramuscular Tomorrow-1000 Lytle Butte, MD      . sodium chloride 0.9 % injection 3 mL  3 mL Intravenous Q12H Lytle Butte, MD   3 mL at 01/19/15 0140    Musculoskeletal: Strength & Muscle Tone: within normal limits Gait & Station: not tested Patient leans: N/A  Psychiatric Specialty Exam: Review of Systems  Psychiatric/Behavioral: Positive for depression. Negative for hallucinations, memory loss and substance abuse. The patient does not have insomnia.     Blood pressure 149/56, pulse 63, temperature 98.1 F (36.7 C), temperature source Oral, resp. rate 18, height 5' 5"  (1.651 m), weight 133 lb 9.6 oz (60.601 kg), SpO2  97 %.Body mass index is 22.23 kg/(m^2).  General Appearance: Casual  Eye Contact::  Fair  Speech:  Slow  Volume:  Decreased  Mood:  Depressed  Affect:  Appropriate and Congruent  Thought Process:  Coherent  Orientation:  Full (Time, Place, and Person)  Thought Content:  WDL  Suicidal Thoughts:  No  Homicidal Thoughts:  No  Memory:  Immediate;   Fair  Judgement:  Fair  Insight:  Fair  Psychomotor Activity:  Decreased  Concentration:  Fair  Recall:  [employer] of Knowledge:Fair  Language: Fair  Akathisia:  No  Handed:  Right  AIMS (if indicated):      Assets:  Communication Skills Desire for Improvement Physical Health Social Support  ADL's:  Intact  Cognition: WNL  Sleep:      Treatment Plan Summary: Medication management  Disposition: Patient does not meet criteria for psychiatric inpatient admission. Discussed crisis plan, support from social network, calling 911, coming to the Emergency Department, and calling Suicide Hotline.   Discussed with pt and her family about medications and she will be started on Remeron 7.5 mg po qhs for insomnia She will follow up for PCP for medication management after d/c  She agreed with the plan.       This note was generated in part or whole with voice recognition software. Voice regonition is usually quite accurate but there are transcription errors that can and very often do occur. I apologize for any typographical errors that were not detected and corrected.   Rainey Pines 01/19/2015 2:26 PM

## 2015-01-19 NOTE — Plan of Care (Signed)
Problem: Education: Goal: Knowledge of [place] [facility] Education information/materials will improve Outcome: Progressing Has been provided with General Education Handout. Spoke with patient using the interpreter line for communication.  Problem: Safety: Goal: Ability to remain free from injury will improve Outcome: Progressing Hourly rounds. Standby Assist. Interpreter utilized for care. Remained free of injury of fall this shift.  Problem: Activity: Goal: Risk for activity intolerance will decrease Outcome: Progressing Generalized weakness.  Standby Assist. Rest periods provided.       Problem: Fluid Volume: Goal: Ability to maintain a balanced intake and output will improve Outcome: Progressing Encouraged oral intake. Remains on IVF's.  Problem: Education: Goal: Knowledge of disease or condition will improve Outcome: Progressing Has been provided with General Education Handout. Spoke with patient using the interpreter line for communication.

## 2015-01-19 NOTE — Progress Notes (Signed)
Physical Therapy Evaluation Patient Details Name: Annette Crawford MRN: [id number] DOB: 1934-07-21 Today's Date: 01/19/2015   History of Present Illness  Pt is a 80 y.o. female with a known history of CVA with residual left-sided weakness, HTN, hyperlipidemia, and type 2 diabetes non-insulin-requiring who is presenting with abdominal pain and nausea as well as worsening left-sided weakness. Patient and family described 2 day duration of symptoms including nausea, vomiting of nonbloody nonbilious emesis, associated epigastric pain-unable to further quantify/qualify pain, poor by mouth intake, as well as worsening left-sided weakness. History obtained through video interpreter. This morning, pt is concerned about "not eating for 3 days" and being hungry. She is verbally conversive and following directions given via Interpreter. No obvious dysarthria as Interpreter communicated easily w/ pt and pt was able to express her thoughts and needs.   Clinical Impression   Pt presents at baseline with L UE weakness from stroke ~8 months ago.  Pt ambulates with a cane at baseline and demonstrated modified independence with transfers and mobility this date.  Pt able to ambulate 200' with RW and supervision and able to maintain static and dynamic balance with all functional activities and no deviations.  No further PT indicated at this time.      Follow Up Recommendations  Rec. home with family and cane and to continue with hand exercises.    Nurse, learning disability    Recommendations for Other Services       Precautions / Restrictions Precautions Precautions: Fall Precaution Comments: Moderate Restrictions Weight Bearing Restrictions: No      Mobility  Bed Mobility Overal bed mobility: Modified Independent             General bed mobility comments: per CNA report  Transfers Overall transfer level: Modified independent Equipment used: Rolling walker (2 wheeled)             General transfer comment: Sit<>stand on first trial with good body mechanics and supervision only.  Ambulation/Gait Ambulation/Gait assistance: Modified independent (Device/Increase time) Ambulation Distance (Feet): 200 Feet Assistive device: Rolling walker (2 wheeled) Gait Pattern/deviations: Step-through pattern Gait velocity: decreased   General Gait Details: 200' with RW and step through gait pattern with steady cadence and no gross balance deficits noted.  Stairs            Wheelchair Mobility    Modified Rankin (Stroke Patients Only)       Balance Overall balance assessment: Modified Independent                                           Pertinent Vitals/Pain Pain Assessment: No/denies pain    Home Living Family/patient expects to be discharged to:: Private residence Living Arrangements: Children Available Help at Discharge: Family Type of Home: House Home Access: Level entry     Home Layout: One level Home Equipment: Cane - single point      Prior Function Level of Independence: Independent with assistive device(s)         Comments: Home alone during the day while children at work.     Hand Dominance        Extremity/Trunk Assessment   Upper Extremity Assessment: LUE deficits/detail       LUE Deficits / Details: General UE weakness in shoulder, elbow, and hand; grossly tested 3/5 with noticeable decreased grip strength.   Lower Extremity Assessment: Overall [facility]  for tasks assessed         Communication   Communication: Interpreter utilized (Arabic)  Cognition Arousal/Alertness: Awake/alert Behavior During Therapy: WFL for tasks assessed/performed Overall Cognitive Status: Within Functional Limits for tasks assessed                      General Comments General comments (skin integrity, edema, etc.): visible areas c/d/i    Exercises        Assessment/Plan    PT Assessment Patent does not need  any further PT services  PT Diagnosis Generalized weakness   PT Problem List    PT Treatment Interventions     PT Goals (Current goals can be found in the Care Plan section) Acute Rehab PT Goals Patient Stated Goal: None stated    Frequency     Barriers to discharge        Co-evaluation               End of Session Equipment Utilized During Treatment: Gait belt Activity Tolerance: Patient tolerated treatment well Patient left: in bed;with chair alarm set;with call bell/phone within reach Nurse Communication: Mobility status    Functional Assessment Tool Used: clinical judgement Functional Limitation: Mobility: Walking and moving around Mobility: Walking and Moving Around Current Status ([id number]([id number]): At least 1 percent but less than 20 percent impaired, limited or restricted Mobility: Walking and Moving Around Goal Status [phone]([id number]): At least 1 percent but less than 20 percent impaired, limited or restricted Mobility: Walking and Moving Around Discharge Status [phone](G8980): At least 1 percent but less than 20 percent impaired, limited or restricted    Time: 1155-1230 PT Time Calculation (min) (ACUTE ONLY): 35 min   Charges:   PT Evaluation $Initial PT Evaluation Tier I: 1 Procedure     PT G Codes:   PT G-Codes **NOT FOR INPATIENT CLASS** Functional Assessment Tool Used: clinical judgement Functional Limitation: Mobility: Walking and moving around Mobility: Walking and Moving Around Current Status ([id number](G8978): At least 1 percent but less than 20 percent impaired, limited or restricted Mobility: Walking and Moving Around Goal Status [phone](G8979): At least 1 percent but less than 20 percent impaired, limited or restricted Mobility: Walking and Moving Around Discharge Status [phone](G8980): At least 1 percent but less than 20 percent impaired, limited or restricted    Abriel Geesey A Dyamond Tolosa 01/19/2015, 12:36 PM

## 2015-01-19 NOTE — Progress Notes (Signed)
[facility] - [place] at [facility]   PATIENT NAME: Annette Crawford    MR#:  [id number]  DATE OF BIRTH:  07-31-1934  SUBJECTIVE:  I used aerobic interpreter   Patient is reporting last of appetite for over 1 week. Patient denies abdominal pain or nausea. Patient denies diarrhea or constipation. Patient also states that she has had increased left-sided weakness from her baseline. Patient also reports she is depressed but does not have suicidal thoughts.  REVIEW OF SYSTEMS:    Review of Systems  Constitutional: Negative for fever, chills and malaise/fatigue.  HENT: Negative for sore throat.   Eyes: Negative for blurred vision.  Respiratory: Negative for cough, hemoptysis, shortness of breath and wheezing.   Cardiovascular: Negative for chest pain, palpitations and leg swelling.  Gastrointestinal: Negative for nausea, vomiting, abdominal pain, diarrhea and blood in stool.       Loss of appetite  Genitourinary: Negative for dysuria.  Musculoskeletal: Negative for back pain.  Neurological: Positive for focal weakness. Negative for dizziness, tremors and headaches.  Endo/Heme/Allergies: Does not bruise/bleed easily.  Psychiatric/Behavioral: Positive for depression and suicidal ideas.    Tolerating Diet: Yes      DRUG ALLERGIES:  No Known Allergies  VITALS:  Blood pressure 149/56, pulse 63, temperature 98.1 F (36.7 C), temperature source Oral, resp. rate 18, height  (1.651 m), weight 60.601 kg (133 lb 9.6 oz), SpO2 97 %.  PHYSICAL EXAMINATION:   Physical Exam    LABORATORY PANEL:   CBC  Recent Labs Lab 01/19/15 0556  WBC 6.2  HGB 11.2*  HCT 33.1*  PLT 134*   ------------------------------------------------------------------------------------------------------------------  Chemistries   Recent Labs Lab 01/18/15 1345 01/19/15 0556  NA  --  141  K  --  4.0  CL  --  109  CO2  --  26  GLUCOSE  --  106*  BUN  --  19   CREATININE  --  0.79  CALCIUM  --  8.7*  AST 19  --   ALT 11*  --   ALKPHOS 56  --   BILITOT 0.9  --    ------------------------------------------------------------------------------------------------------------------  Cardiac Enzymes  Recent Labs Lab 01/18/15 1341 01/18/15 1825  TROPONINI <0.03 <0.03   ------------------------------------------------------------------------------------------------------------------  RADIOLOGY:  Dg Chest 2 View  01/18/2015  CLINICAL DATA:  Vomiting, weakness and chest pain 1 week. Shortness of breath since yesterday. EXAM: CHEST  2 VIEW COMPARISON:  04/02/2014 and 11/21/2011 FINDINGS: Patient slightly rotated to the left. Lungs are hypoinflated without focal consolidation or effusion. There is subtle prominence of the perihilar markings which may be due to minimal vascular congestion versus acute bronchitic process. Cardiomediastinal silhouette is within normal. There is calcified plaque over the aortic arch. There are mild degenerative changes of the spine. IMPRESSION: Subtle prominence of the central perihilar markings which may be due to mild vascular congestion versus an acute bronchitic process. Electronically Signed   By: Elberta Fortis M.D.   On: 01/18/2015 14:05   Ct Head Wo Contrast  01/18/2015  CLINICAL DATA:  80 year old female with vomiting, weakness, chest pain and shortness of breath EXAM: CT HEAD WITHOUT CONTRAST TECHNIQUE: Contiguous axial images were obtained from the base of the skull through the vertex without intravenous contrast. COMPARISON:  Prior head CT 06/01/2014 FINDINGS: Negative for acute intracranial hemorrhage, acute infarction, mass, mass effect, hydrocephalus or midline shift. Gray-white differentiation is preserved throughout. Stable remote right posterior MCA territory infarct with resultant encephalomalacia. Stable chronic cerebral cortical atrophy  and mild chronic microvascular ischemic white matter disease including  bilateral basal ganglia lacunar infarcts. Up no focal soft tissue or calvarial abnormality. Symmetric globes and orbits bilaterally. Normal aeration of the mastoid air cells and paranasal sinuses. Atherosclerotic calcifications of the bilateral cavernous and supraclinoid carotid arteries. Mild hyperostosis frontalis interna. IMPRESSION: 1. No acute intracranial abnormality. 2. Stable appearance of remote right posterior MCA territory infarct. Electronically Signed   By: Malachy Moan M.D.   On: 01/18/2015 15:51   Ct Abdomen Pelvis W Contrast  01/18/2015  CLINICAL DATA:  Patient has had vomiting for two days. She has had constipation. She denies abdominal pain. EXAM: CT ABDOMEN AND PELVIS WITH CONTRAST TECHNIQUE: Multidetector CT imaging of the abdomen and pelvis was performed using the standard protocol following bolus administration of intravenous contrast. CONTRAST:  OMNIPAQUE IOHEXOL 300 MG/ML  SOLN COMPARISON:  06/01/2014 FINDINGS: Lower chest:  Mild bibasilar atelectasis.  Mild cardiac enlargement. Hepatobiliary: Very mild hepatic steatosis. Pancreas: Normal Spleen: Normal Adrenals/Urinary Tract: Adrenal glands are normal. Small low-attenuation renal lesions bilaterally too small to characterize a likely cysts. Both ureters mildly prominent likely related to bladder distention. Nonobstructing 3 mm stone midpole right kidney. Stomach/Bowel: Distal esophageal wall thickening. Small hiatal hernia. Small bowel is normal. Appendix is normal. Large bowel normal. Nonobstructive bowel gas pattern. Fecal retention is not particularly prominent. Vascular/Lymphatic: No acute findings Reproductive: Negative Other: No ascites Musculoskeletal: No acute abnormalities IMPRESSION: Distal esophageal wall thickening suggests esophagitis. There are otherwise no acute findings. Electronically Signed   By: Esperanza Heir M.D.   On: 01/18/2015 20:06   Dg Abd 2 Views  01/18/2015  CLINICAL DATA:  Decreased appetite  for 1 week, abdominal pain starting yesterday EXAM: ABDOMEN - 2 VIEW COMPARISON:  06/01/2014 FINDINGS: Degenerative changes lumbar spine L4-L5 level. Minimal gaseous distended small bowel loops mid abdomen. Mild ileus or enteritis cannot be excluded. No small bowel air-fluid levels. No free abdominal air. IMPRESSION: Mild gaseous distended small bowel loops mid abdomen. Ileus or enteritis cannot be excluded. No small bowel air-fluid levels. No free abdominal air. Electronically Signed   By: Natasha Mead M.D.   On: 01/18/2015 15:59     ASSESSMENT AND PLAN:   80 year old female with a history of CVA with residual left-sided weakness who presents with abdominal pain and increased left-sided weakness.  1. Left-sided weakness: Patient has baseline left-sided weakness from previous CVA. Patient  was admitted for  evaluation forTIA/CVA. She has agreed to MRI and carotid Doppler. Aspirin and statin. Continue neuro checks and OT and PT evaluation.  2. Depression: Patient will need psychiatric evaluation for depression and poor appetite. Consult has been placed. 3. Esophagitis: Patient apparently presented with nausea and vomiting and abdominal pain. CT scan was positive for esophagitis. Continue PPI.  4.type 2 diabetes: Continue sliding scale insulin.resume glipizide however I am holding metformin for now.  5. Essential hypertension: Lisinopril/HCTZ at is on hold for now due to problem #1. If stroke is ruled out we may resume his outpatient medications.   plan of care was discussed with the patient via  Arabic translator andshe is in agreement with plan.  CODE STATUS: full  TOTAL TIME TAKING CARE OF THIS PATIENT: 40 minutes.     POSSIBLE D/C tomorrow, DEPENDING ON CLINICAL CONDITION.   Angeles Paolucci M.D on 01/19/2015 at 1:24 PM  Between 7am to 6pm - Pager - [phone] After 6pm go to [email or web address] - Scientist, research (life sciences) Hospitalists  Office  [phone]  CC: Primary care  physician; Edwena FeltyAshany Sundaram, MD  Note: This dictation was prepared with Dragon dictation along with smaller phrase technology. Any transcriptional errors that result from this process are unintentional.

## 2015-01-19 NOTE — Evaluation (Signed)
Clinical/Bedside Swallow Evaluation Patient Details  Name: Annette Crawford MRN: [id number] Date of Birth: [birth year]  Today's Date: 01/19/2015 Time: SLP Start Time (ACUTE ONLY): 1000 SLP Stop Time (ACUTE ONLY): 1100 SLP Time Calculation (min) (ACUTE ONLY): 60 min  Past Medical History:  Past Medical History  Diagnosis Date  . Diabetes mellitus without complication (HCC)   . Hypertension   . Stroke (HCC)   . Hyperlipidemia    Past Surgical History: History reviewed. No pertinent past surgical history. HPI:  Pt is a 80 y.o. female with a known history of CVA with residual left-sided weakness, HTN, hyperlipidemia, and type 2 diabetes non-insulin-requiring who is presenting with abdominal pain and nausea as well as worsening left-sided weakness. Patient and family described 2 day duration of symptoms including nausea, vomiting of nonbloody nonbilious emesis, associated epigastric pain-unable to further quantify/qualify pain, poor by mouth intake, as well as worsening left-sided weakness. History obtained through video interpreter. This morning, pt is concerned about "not eating for 3 days" and being hungry. She is verbally conversive and following directions given via Interpreter. No obvious dysarthria as Interpreter communicated easily w/ pt and pt was able to express her thoughts and needs.   Assessment / Plan / Recommendation Clinical Impression  Pt appeared to adequately tolerate trials of all consistencies w/ no overt s/s of aspiration noted during oral intake; no coughing or throat clearing noted and no decline in vocal quality or respiratory status noted. Pt exhibited trace, inconsistent lingual/oral residue on Left side of oral cavity but cleared this w/ lingual sweeping independently. Suspect this is related to her old CVA w/ residual Left sided weakness. Pt fed self given min. setup assist. Pt appears at reduced risk for aspiration at this time following general aspiration  precautions including sitting up for any eating/drinking. This was given as instruction; pt nodded. Rec. a regular diet (which was ordered); NSG to give meds in puree only if any difficulty swallowing w/ liquids. No further skilled ST services indicated at this time but ST services will be available if any change in status while admitted.     Aspiration Risk   (reduced)    Diet Recommendation  regular; thin liquids; general aspiration precautions  Medication Administration: Whole meds with liquid    Other  Recommendations Recommended Consults:  (Dietician following) Oral Care Recommendations: Oral care BID;Patient independent with oral care   Follow up Recommendations  None    Frequency and Duration            Prognosis Prognosis for Safe Diet Advancement: Good Barriers to Reach Goals:  (none)      Swallow Study   General Date of Onset: 01/18/15 HPI: Pt is a 80 y.o. female with a known history of CVA with residual left-sided weakness, HTN, hyperlipidemia, and type 2 diabetes non-insulin-requiring who is presenting with abdominal pain and nausea as well as worsening left-sided weakness. Patient and family described 2 day duration of symptoms including nausea, vomiting of nonbloody nonbilious emesis, associated epigastric pain-unable to further quantify/qualify pain, poor by mouth intake, as well as worsening left-sided weakness. History obtained through video interpreter. This morning, pt is concerned about "not eating for 3 days" and being hungry. She is verbally conversive and following directions given via Interpreter. No obvious dysarthria as Interpreter communicated easily w/ pt and pt was able to express her thoughts and needs. Type of Study: Bedside Swallow Evaluation Previous Swallow Assessment: none indicated Diet Prior to this Study: Regular;Thin liquids (per pt indication)  Temperature Spikes Noted: No (wbc 6.2) Respiratory Status: Room air History of Recent Intubation:  No Behavior/Cognition: Alert;Cooperative;Pleasant mood Oral Care Completed by SLP: No Oral Cavity - Dentition: Adequate natural dentition Vision: Functional for self-feeding Self-Feeding Abilities: Able to feed self (given setup) Patient Positioning: Upright in chair Baseline Vocal Quality: Normal Volitional Cough: Strong    Oral/Motor/Sensory Function Overall Oral Motor/Sensory Function: Within functional limits (appeared wfl w/ po trials/bolus management)   Ice Chips Ice chips: Not tested   Thin Liquid Thin Liquid: Within functional limits Presentation: Cup;Straw;Self Fed (10+ trials)    Nectar Thick Nectar Thick Liquid: Not tested   Honey Thick Honey Thick Liquid: Not tested   Puree Puree: Within functional limits Presentation: Self Fed;Spoon (4 ozs+)   Solid Solid: Within functional limits Presentation: Self Fed;Spoon (graham crackers and eggs, pancakes(breakfast meal))      Jerilynn SomKatherine Watson, MS, CCC-SLP  Watson,Katherine 01/19/2015,11:53 AM

## 2015-01-19 NOTE — Progress Notes (Signed)
Initial Nutrition Assessment   INTERVENTION:   Meals and Snacks: Cater to patient preferences, SLP following. Will recommend Carb Modified diet order pending blood sugars. Medical Food Supplement Therapy: will recommend Sugar Free Mighty Shakes on meal trays BID for added nutrition (each shake provides approximately 300kcals and 11g protein)   NUTRITION DIAGNOSIS:   Inadequate oral intake related to poor appetite as evidenced by per patient/family report.  GOAL:   Patient will meet greater than or equal to 90% of their needs  MONITOR:    (Energy Intake, Electrolyte and Renal Profile, Anthropometrics, Digestive System)  REASON FOR ASSESSMENT:   Malnutrition Screening Tool    ASSESSMENT:   Pt admitted with chest pain secondary to possible TIA. Per MD note, pt with n/v for 2 days PTA; CT consistent with esophagitis. Pt arabic speaking, visit made with interpretor via [employer].  Past Medical History  Diagnosis Date  . Diabetes mellitus without complication (HCC)   . Hypertension   . Stroke (HCC)   . Hyperlipidemia     Diet Order:  Diet regular Room service appropriate?: Yes; Fluid consistency:: Thin   Current Nutrition: Pt eating ice cream and graham crackers this am on visit with SLP. SLP later reports pt ate 75% of breakfast tray this am including pancakes, eggs, fruit cup.   Food/Nutrition-Related History: Via interpretor, pt reports good appetite PTA eating 3 meals per day. RD notes pt reported n/v and poor po intake for 2 days PTA. Per SLP conversation via interpretor pt reports not eating for 3 days PTA.   Scheduled Medications:  . aspirin  300 mg Rectal Daily   Or  . aspirin  325 mg Oral Daily  . atorvastatin  10 mg Oral QHS  . [START ON 01/20/2015] Influenza vac split quadrivalent PF  0.5 mL Intramuscular Tomorrow-1000  . insulin aspart  0-5 Units Subcutaneous QHS  . insulin aspart  0-9 Units Subcutaneous TID WC  . pantoprazole  40 mg Oral BID  . [START  ON 01/20/2015] pneumococcal 23 valent vaccine  0.5 mL Intramuscular Tomorrow-1000  . sodium chloride  3 mL Intravenous Q12H    Continuous Medications:  . sodium chloride 100 mL/hr at 01/19/15 1134     Electrolyte/Renal Profile and Glucose Profile:   Recent Labs Lab 01/18/15 1341 01/19/15 0556  NA 138 141  K 4.3 4.0  CL 105 109  CO2 26 26  BUN 24* 19  CREATININE 0.84 0.79  CALCIUM 8.9 8.7*  GLUCOSE 95 106*   Protein Profile:  Recent Labs Lab 01/18/15 1345  ALBUMIN 4.0    Gastrointestinal Profile: Last BM: unknown   Nutrition-Focused Physical Exam Findings:  Unable to complete Nutrition-Focused physical exam at this time.    Weight Change: Pt reports she may have had weight loss months ago when she spent 20days at [facility] Per [facility] weight encounters, pt has had relatively stable weight since May 2016.   Height:   Ht Readings from Last 1 Encounters:  01/19/15  (1.651 m)    Weight:   Wt Readings from Last 1 Encounters:  01/19/15 133 lb 9.6 oz (60.601 kg)   Wt Readings from Last 10 Encounters:  01/19/15 133 lb 9.6 oz (60.601 kg)  12/16/14 134 lb 12.8 oz (61.145 kg)  06/01/14 130 lb (58.968 kg)    BMI:  Body mass index is 22.23 kg/(m^2).  Estimated Nutritional Needs:   Kcal:  BEE: 1086kcals, TEE: (IF 1.1-1.3)(AF 1.3) 1554-1836kcals  Protein:  60-72g protein (1.0-1.2g/kg)  Fluid:  1515-184618mL of fluid (25-1530mL/kg)  EDUCATION NEEDS:   No education needs identified at this time   MODERATE Care Level  Leda QuailAllyson Tyquan Carmickle, RD, LDN Pager [phone] Weekend/On-Call Pager [phone]

## 2015-01-20 ENCOUNTER — Observation Stay: Payer: Medicaid Other

## 2015-01-20 ENCOUNTER — Telehealth: Payer: Self-pay | Admitting: Family Medicine

## 2015-01-20 LAB — GLUCOSE, CAPILLARY
Glucose-Capillary: 210 mg/dL — ABNORMAL HIGH (ref 65–99)
Glucose-Capillary: 133 mg/dL — ABNORMAL HIGH (ref 65–99)

## 2015-01-20 MED ORDER — ASPIRIN 325 MG PO TABS: 325.0000 mg | ORAL_TABLET | Freq: Every day | ORAL | Status: AC

## 2015-01-20 MED ORDER — MIRTAZAPINE 7.5 MG PO TABS: 7.5000 mg | ORAL_TABLET | Freq: Every day | ORAL | Status: DC

## 2015-01-20 MED ORDER — PANTOPRAZOLE SODIUM 40 MG PO TBEC: 40.0000 mg | DELAYED_RELEASE_TABLET | Freq: Every day | ORAL | Status: DC

## 2015-01-20 NOTE — Discharge Summary (Signed)
[facility] [place] at [facility]   PATIENT NAME: Annette Crawford    MR#:  [id number]  DATE OF BIRTH:  11/04/1934  DATE OF ADMISSION:  01/18/2015 ADMITTING PHYSICIAN: Wyatt Hasteavid K Hower, MD  DATE OF DISCHARGE: 01/20/2015 PRIMARY CARE PHYSICIAN: Edwena FeltyAshany Sundaram, MD    ADMISSION DIAGNOSIS:  Pain [R52] Weakness of left upper extremity [R29.898] Non-intractable vomiting with nausea, vomiting of unspecified type [R11.2]  DISCHARGE DIAGNOSIS:  Principal Problem:   Left sided weakness residual exacerbated from nausea Active Problems:   Mood disorder in conditions classified elsewhere   SECONDARY DIAGNOSIS:   Past Medical History  Diagnosis Date  . Diabetes mellitus without complication (HCC)   . Hypertension   . Stroke (HCC)   . Hyperlipidemia     HOSPITAL COURSE:  80 year old female with a history of CVA with residual left-sided weakness who presents with abdominal pain and increased left-sided weakness.  1. Left-sided weakness: Patient has baseline left-sided weakness from previous CVA. Patient was admitted for evaluation forTIA/CVA. MRI and carotid Doppler were negative for acute CVA and significant stenosis, respectively. CONTINUE Aspirin and statin. She does not need Home health as per PT consult.  2. Depression/Mood disorder with INSOMINA: PSYCH evaluation was obtained. She denies depression to the PSYCHIATRIST, but reported to us she was depressed. She denied suicidal thoughts. Family was concerned about insomonia. She is started on REMERON as per consult. She will follow up for PCP for medication management after d/c. I feel that the patient may have some cognitive impairment as well and will need evaluation as an outpatient.   3. Esophagitis: Patient apparently presented with nausea and vomiting and abdominal pain. CT scan was positive for esophagitis. Continue PPI.  4.type 2 diabetes: Resume glipizide and metformin. 5. Essential  hypertension: CONTINUE Lisinopril/HCTZ   DISCHARGE CONDITIONS AND DIET:  Home  Heart healthy diabetic diet   CONSULTS OBTAINED:  Treatment Team:  Wyatt Hasteavid K Hower, MD Brandy HaleUzma Faheem, MD  DRUG ALLERGIES:  No Known Allergies  DISCHARGE MEDICATIONS:   Current Discharge Medication List    START taking these medications   Details  aspirin 325 MG tablet Take 1 tablet (325 mg total) by mouth daily. Qty: 120 tablet, Refills: 0    mirtazapine (REMERON) 7.5 MG tablet Take 1 tablet (7.5 mg total) by mouth at bedtime. Qty: 30 tablet, Refills: 0   protonix 40 mg po daily    CONTINUE these medications which have NOT CHANGED   Details  atorvastatin (LIPITOR) 10 MG tablet Take 1 tablet (10 mg total) by mouth daily. Qty: 90 tablet, Refills: 2   Associated Diagnoses: Hyperlipidemia LDL goal <70    glimepiride (AMARYL) 2 MG tablet Take 1 tablet (2 mg total) by mouth 3 (three) times daily. Qty: 270 tablet, Refills: 2   Associated Diagnoses: Controlled type 2 DM with peripheral circulatory disorder (HCC)    lisinopril-hydrochlorothiazide (ZESTORETIC) 20-12.5 MG tablet Take 1 tablet by mouth 2 (two) times daily. Qty: 180 tablet, Refills: 2   Associated Diagnoses: Hypertension goal BP (blood pressure) < 140/90    metFORMIN (GLUCOPHAGE) 500 MG tablet Take 1 tablet (500 mg total) by mouth 3 (three) times daily. Qty: 270 tablet, Refills: 2   Associated Diagnoses: Controlled type 2 DM with peripheral circulatory disorder (HCC)              Today   CHIEF COMPLAINT:  Doing well ate breakfast this am  [facility](THROUGH INTERPRETER)   VITAL SIGNS:  Blood pressure 158/75, pulse 70,  temperature 97.5 F (36.4 C), temperature source Oral, resp. rate 18, height 5\' 5"  (1.651 m), weight 60.601 kg (133 lb 9.6 oz), SpO2 100 %.   REVIEW OF SYSTEMS:  Review of Systems  Constitutional: Negative for fever, chills and malaise/fatigue.  HENT: Negative for sore throat.   Eyes: Negative for blurred  vision.  Respiratory: Negative for cough, hemoptysis, shortness of breath and wheezing.   Cardiovascular: Negative for chest pain, palpitations and leg swelling.  Gastrointestinal: Negative for nausea, vomiting, abdominal pain, diarrhea and blood in stool.  Genitourinary: Negative for dysuria.  Musculoskeletal: Negative for back pain.  Neurological: Negative for dizziness, tremors and headaches. Focal weakness: residual left sided weakness.  Endo/Heme/Allergies: Does not bruise/bleed easily.     PHYSICAL EXAMINATION:  GENERAL:  80 y.o.-year-old patient lying in the bed with no acute distress.  NECK:  Supple, no jugular venous distention. No thyroid enlargement, no tenderness.  LUNGS: Normal breath sounds bilaterally, no wheezing, rales,rhonchi  No use of accessory muscles of respiration.  CARDIOVASCULAR: S1, S2 normal. No murmurs, rubs, or gallops.  ABDOMEN: Soft, non-tender, non-distended. Bowel sounds present. No organomegaly or mass.  EXTREMITIES: No pedal edema, cyanosis, or clubbing.  PSYCHIATRIC: The patient is alert and oriented x 3.  FLAT AFFECT SKIN: No obvious rash, lesion, or ulcer.  Old left sided UE weakness DATA REVIEW:   CBC  Recent Labs Lab 01/19/15 0556  WBC 6.2  HGB 11.2*  HCT 33.1*  PLT 134*    Chemistries   Recent Labs Lab 01/18/15 1345 01/19/15 0556  NA  --  141  K  --  4.0  CL  --  109  CO2  --  26  GLUCOSE  --  106*  BUN  --  19  CREATININE  --  0.79  CALCIUM  --  8.7*  AST 19  --   ALT 11*  --   ALKPHOS 56  --   BILITOT 0.9  --     Cardiac Enzymes  Recent Labs Lab 01/18/15 1341 01/18/15 1825  TROPONINI <0.03 <0.03    Microbiology Results  @[email or web address] @  RADIOLOGY:  Dg Chest 2 View  01/18/2015  CLINICAL DATA:  Vomiting, weakness and chest pain 1 week. Shortness of breath since yesterday. EXAM: CHEST  2 VIEW COMPARISON:  04/02/2014 and 11/21/2011 FINDINGS: Patient slightly rotated to the left. Lungs are hypoinflated without  focal consolidation or effusion. There is subtle prominence of the perihilar markings which may be due to minimal vascular congestion versus acute bronchitic process. Cardiomediastinal silhouette is within normal. There is calcified plaque over the aortic arch. There are mild degenerative changes of the spine. IMPRESSION: Subtle prominence of the central perihilar markings which may be due to mild vascular congestion versus an acute bronchitic process. Electronically Signed   By: Elberta Fortis M.D.   On: 01/18/2015 14:05   Ct Head Wo Contrast  01/18/2015  CLINICAL DATA:  80 year old female with vomiting, weakness, chest pain and shortness of breath EXAM: CT HEAD WITHOUT CONTRAST TECHNIQUE: Contiguous axial images were obtained from the base of the skull through the vertex without intravenous contrast. COMPARISON:  Prior head CT 06/01/2014 FINDINGS: Negative for acute intracranial hemorrhage, acute infarction, mass, mass effect, hydrocephalus or midline shift. Gray-white differentiation is preserved throughout. Stable remote right posterior MCA territory infarct with resultant encephalomalacia. Stable chronic cerebral cortical atrophy and mild chronic microvascular ischemic white matter disease including bilateral basal ganglia lacunar infarcts. Up no focal soft tissue or calvarial abnormality. Symmetric globes and  orbits bilaterally. Normal aeration of the mastoid air cells and paranasal sinuses. Atherosclerotic calcifications of the bilateral cavernous and supraclinoid carotid arteries. Mild hyperostosis frontalis interna. IMPRESSION: 1. No acute intracranial abnormality. 2. Stable appearance of remote right posterior MCA territory infarct. Electronically Signed   By: Malachy Moan M.D.   On: 01/18/2015 15:51   Mr Brain Wo Contrast  01/20/2015  CLINICAL DATA:  Vomiting, weakness, chest pain and shortness of breath. History of stroke and LEFT-sided weakness, diabetes. EXAM: MRI HEAD WITHOUT CONTRAST  TECHNIQUE: Multiplanar, multiecho pulse sequences of the brain and surrounding structures were obtained without intravenous contrast. COMPARISON:  CT head January 18, 2015 FINDINGS: No reduced diffusion to suggest acute ischemia. Patchy T2 shine through about RIGHT frontal parietal encephalomalacia. No susceptibility artifact to suggest hemorrhage. Multiple old small LEFT cerebellar infarcts. Moderate ventriculomegaly on the basis of global parenchymal brain volume loss. Old LEFT thalamus lacunar infarcts. Minimal white matter changes exclusive of the aforementioned abnormalities compatible with chronic small vessel ischemic disease. No abnormal extra-axial fluid collections. Normal major intracranial vascular flow voids present at skull base. Status post bilateral ocular lens implants. Mild paranasal sinus mucosal thickening without air-fluid levels. Mastoid air cells are well aerated. No abnormal sellar expansion. No cerebellar tonsillar ectopia. No suspicious calvarial bone marrow signal. IMPRESSION: No acute intracranial process, specifically no acute ischemia. Old RIGHT MCA territory infarct. Old small LEFT cerebellar infarcts, old LEFT thalamus lacunar infarct. Electronically Signed   By: Awilda Metro M.D.   On: 01/20/2015 02:06   Ct Abdomen Pelvis W Contrast  01/18/2015  CLINICAL DATA:  Patient has had vomiting for two days. She has had constipation. She denies abdominal pain. EXAM: CT ABDOMEN AND PELVIS WITH CONTRAST TECHNIQUE: Multidetector CT imaging of the abdomen and pelvis was performed using the standard protocol following bolus administration of intravenous contrast. CONTRAST:  OMNIPAQUE IOHEXOL 300 MG/ML  SOLN COMPARISON:  06/01/2014 FINDINGS: Lower chest:  Mild bibasilar atelectasis.  Mild cardiac enlargement. Hepatobiliary: Very mild hepatic steatosis. Pancreas: Normal Spleen: Normal Adrenals/Urinary Tract: Adrenal glands are normal. Small low-attenuation renal lesions bilaterally  too small to characterize a likely cysts. Both ureters mildly prominent likely related to bladder distention. Nonobstructing 3 mm stone midpole right kidney. Stomach/Bowel: Distal esophageal wall thickening. Small hiatal hernia. Small bowel is normal. Appendix is normal. Large bowel normal. Nonobstructive bowel gas pattern. Fecal retention is not particularly prominent. Vascular/Lymphatic: No acute findings Reproductive: Negative Other: No ascites Musculoskeletal: No acute abnormalities IMPRESSION: Distal esophageal wall thickening suggests esophagitis. There are otherwise no acute findings. Electronically Signed   By: Esperanza Heir M.D.   On: 01/18/2015 20:06   US Carotid Bilateral  01/20/2015  CLINICAL DATA:  80 year old female with a history of syncope. Cardiovascular risk factors include hypertension, prior stroke/ TIA, diabetes. EXAM: BILATERAL CAROTID DUPLEX ULTRASOUND TECHNIQUE: Wallace Cullens scale imaging, color Doppler and duplex ultrasound were performed of bilateral carotid and vertebral arteries in the neck. COMPARISON:  No prior duplex FINDINGS: Criteria: Quantification of carotid stenosis is based on velocity parameters that correlate the residual internal carotid diameter with NASCET-based stenosis levels, using the diameter of the distal internal carotid lumen as the denominator for stenosis measurement. The following velocity measurements were obtained: RIGHT ICA:  Systolic 82 cm/sec, Diastolic 19 cm/sec CCA:  51 cm/sec SYSTOLIC ICA/CCA RATIO:  1.6 ECA:  61 cm/sec LEFT ICA:  Systolic 124 cm/sec, Diastolic 37 cm/sec CCA:  86 cm/sec SYSTOLIC ICA/CCA RATIO:  1.5 ECA:  107 cm/sec Right Brachial SBP: Not acquired  Left Brachial SBP: Not acquired RIGHT CAROTID ARTERY: No significant calcifications of the right common carotid artery. Intermediate waveform maintained. Heterogeneous and minimally calcified plaque at the right carotid bifurcation. No significant lumen shadowing. Low resistance waveform of the  right ICA. Mild tortuosity. RIGHT VERTEBRAL ARTERY: Antegrade flow with low resistance waveform. LEFT CAROTID ARTERY: No significant calcifications of the left common carotid artery. Intermediate waveform maintained. Heterogeneous and minimally calcified plaque at the left carotid bifurcation without significant lumen shadowing. Low resistance waveform of the left ICA. Mild tortuosity. LEFT VERTEBRAL ARTERY:  Antegrade flow with low resistance waveform. IMPRESSION: Color duplex indicates minimal heterogeneous and calcified plaque, with no hemodynamically significant stenosis by duplex criteria in the extracranial cerebrovascular circulation. Signed, Yvone Neu. Loreta Ave, DO [facility] Electronically Signed   By: Gilmer Mor D.O.   On: 01/20/2015 10:30   Dg Abd 2 Views  01/18/2015  CLINICAL DATA:  Decreased appetite for 1 week, abdominal pain starting yesterday EXAM: ABDOMEN - 2 VIEW COMPARISON:  06/01/2014 FINDINGS: Degenerative changes lumbar spine L4-L5 level. Minimal gaseous distended small bowel loops mid abdomen. Mild ileus or enteritis cannot be excluded. No small bowel air-fluid levels. No free abdominal air. IMPRESSION: Mild gaseous distended small bowel loops mid abdomen. Ileus or enteritis cannot be excluded. No small bowel air-fluid levels. No free abdominal air. Electronically Signed   By: Natasha Mead M.D.   On: 01/18/2015 15:59      Management plans discussed with the patient and she is in agreement. Stable for discharge home  Patient should follow up with PCP in 1 week  CODE STATUS:     Code Status Orders        Start     Ordered   01/18/15 2207  Full code   Continuous     01/18/15 2207    Advance Directive Documentation        Most Recent Value   Type of Advance Directive  Healthcare Power of Attorney, Living will   Pre-existing out of facility DNR order (yellow form or pink MOST form)     "MOST" Form in Place?         TOTAL TIME TAKING CARE OF THIS PATIENT: 35 minutes.    Note: This dictation was prepared with Dragon dictation along with smaller phrase technology. Any transcriptional errors that result from this process are unintentional.  Alegandro Macnaughton M.D on 01/20/2015 at 11:59 AM  Between 7am to 6pm - Pager - [phone] After 6pm go to [email or web address] - password [email or web address] [facility]  [facility]  Office  [phone]  CC: Primary care physician; Edwena Felty, MD

## 2015-01-20 NOTE — Telephone Encounter (Signed)
ERROR

## 2015-01-20 NOTE — Progress Notes (Signed)
Inpatient Diabetes Program Recommendations  AACE/ADA: New Consensus Statement on Inpatient Glycemic Control (2015)  Target Ranges:  Prepandial:   less than 140 mg/dL      Peak postprandial:   less than 180 mg/dL (1-2 hours)      Critically ill patients:  140 - 180 mg/dL   Review of Glycemic Control  Results for ABDEL Marguerite OleaRAHMAN, Dorla JAMIL (MRN [id number]) as of 01/20/2015 10:54  Ref. Range 01/19/2015 07:37 01/19/2015 11:15 01/19/2015 16:29 01/19/2015 22:25 01/20/2015 08:07  Glucose-Capillary Latest Ref Range: 65-99 mg/dL 962101 (H) 952345 (H) 841136 (H) 162 (H) 210 (H)    Diabetes history: Type 2, A1C 5.3% on 01/19/15 Outpatient Diabetes medications: Amaryl 2mg  tid, Metformin 500mg  tid Current orders for Inpatient glycemic control: Amaryl 2mg  3x/day,  Novolog 0-9 units tid with meals, 0-5 units qhs  Inpatient Diabetes Program Recommendations: A1C very low- patient may do better if Amaryl was decreased at discharge (MD H & P note states the patient has "experienced 1 episode of diaphoresis which sounds like a hypoglycemic episode as she continued to take her medications without eating" .)   Consider dropping Amaryl to 2mg  bid, breakfast and supper.  We will continue to follow.   For now, staff will continue to offer and educate re: the importance of Novolog correction insulin when blood sugars are high.    Susette RacerJulie Beva Remund, RN, BA, MHA, CDE Diabetes Coordinator Inpatient Diabetes Program  [phone] (Team Pager) [phone] [facility] Office) 01/20/2015 11:33 AM

## 2015-01-20 NOTE — Progress Notes (Signed)
Speech Language Pathology Treatment: Dysphagia  Patient Details Name: Annette Crawford MRN: [id number] DOB: Dec 14, 1934 Today's Date: 01/20/2015 Time: 1233-1300 SLP Time Calculation (min) (ACUTE ONLY): 27 min  Assessment / Plan / Recommendation Clinical Impression  Pt appears to be adequately tolerating a regular diet consistency w/ thin liquids w/ no overt s/s of aspiration noted by staff; reported by pt. Reviewed chart notes; vitals. No oral phase deficits noted w/ the regular diet consistency during lunch meal. Pt fed self. Rec. Continue w/ current diet w/ general aspiration precautions; Reflux precautions. Pt appears to be at her baseline w/ regard to communication and swallowing(via Interpreter). NSG to reconsult if any change/decline in status while admitted. NSG updated.     HPI HPI: Pt is a 80 y.o. female with a known history of CVA with residual left-sided weakness, HTN, hyperlipidemia, and type 2 diabetes non-insulin-requiring who is presenting with abdominal pain and nausea as well as worsening left-sided weakness. Patient and family described 2 day duration of symptoms including nausea, vomiting of nonbloody nonbilious emesis, associated epigastric pain-unable to further quantify/qualify pain, poor by mouth intake, as well as worsening left-sided weakness. History obtained through video interpreter. Today, pt is verbally conversive iva Interpreter and following directions; no obvious dysarthria as Interpreter communicated easily w/ pt and pt was able to express her thoughts and needs. Pt has been eating w/ no reported dysphagia at breakfast and now lunch meal. Pt is alert.       SLP Plan  All goals met     Recommendations  Diet recommendations: Regular;Thin liquid Liquids provided via: Cup;Straw Medication Administration: Whole meds with liquid Supervision: Patient able to self feed Compensations: Slow rate;Small sips/bites Postural Changes and/or Swallow Maneuvers: Seated  upright 90 degrees              Oral Care Recommendations: Oral care BID;Patient independent with oral care Follow up Recommendations: None Plan: All goals met     [name], MS, CCC-SLP  Watson,Katherine 01/20/2015, 3:10 PM

## 2015-01-20 NOTE — Progress Notes (Signed)
MD making rounds. Discharge orders received. Patient to shcedule appointment.Telemetry Removed. IV removed. Prescription given to patient. Also, 2 (two) prescriptions E-Prescribed to Pharmacy. Discharge paperwork provided, explained, signed and witnessed. Education handouts provided to patient. No unanswered questions. Escorted via wheelchair by Scientist, research (physical sciences)auxiliary staff. All belongings sent with patient and family.

## 2015-01-20 NOTE — Progress Notes (Signed)
PT Cancellation Note  Patient Details Name: Annette Crawford MRN: [id number] DOB: 12/14/1934   Cancelled Treatment:    Reason Eval/Treat Not Completed: Other (comment). Pt evaluated yesterday (01/19/15) by therapy; able to ambulate around RN station. Duplicate order received; spoke with RN. Will dc in house. Completed orders.   Trinidy Masterson 01/20/2015, 11:18 AM  Elizabeth PalauStephanie Juliano Mceachin, PT, DPT [phone]

## 2015-01-21 LAB — URINE CULTURE: Culture: 20000

## 2015-01-27 ENCOUNTER — Encounter: Payer: Self-pay | Admitting: Family Medicine

## 2015-01-27 ENCOUNTER — Ambulatory Visit (INDEPENDENT_AMBULATORY_CARE_PROVIDER_SITE_OTHER): Payer: Medicaid Other | Admitting: Family Medicine

## 2015-01-27 VITALS — BP 144/62 | HR 89 | Temp 98.1°F | Resp 12 | Wt 136.1 lb

## 2015-01-27 DIAGNOSIS — I69354 Hemiplegia and hemiparesis following cerebral infarction affecting left non-dominant side: Secondary | ICD-10-CM | POA: Diagnosis not present

## 2015-01-27 DIAGNOSIS — K209 Esophagitis, unspecified without bleeding: Secondary | ICD-10-CM | POA: Insufficient documentation

## 2015-01-27 DIAGNOSIS — F0630 Mood disorder due to known physiological condition, unspecified: Secondary | ICD-10-CM | POA: Diagnosis not present

## 2015-01-27 DIAGNOSIS — I6930 Unspecified sequelae of cerebral infarction: Secondary | ICD-10-CM

## 2015-01-27 MED ORDER — MIRTAZAPINE 7.5 MG PO TABS: 7.5000 mg | ORAL_TABLET | Freq: Every day | ORAL | Status: DC

## 2015-01-27 MED ORDER — PANTOPRAZOLE SODIUM 40 MG PO TBEC: 40.0000 mg | DELAYED_RELEASE_TABLET | Freq: Every day | ORAL | Status: AC

## 2015-01-27 NOTE — Progress Notes (Signed)
Name: Annette Crawford   MRN: [id number]    DOB: Mar 19, 1934   Date:01/27/2015       Progress Note  Subjective  Chief Complaint  Chief Complaint  Patient presents with  . Hospitalization Follow-up    HPI  Annette Crawford is a 80 year old female accompanied by her son and caretaker today for hospital follow up. Interpretive services used over the phone.   She was taken to the ER on 01/18/15 for nausea and vomiting and discharged on 01/20/15. The family was concerned for or progressive CVA as her first stroke involved initial nausea with vomiting, however repeat CT head and other testing revealed no new cerebrovascular events at this hospital stay. She continues to have baseline left sided weakness. PT was consulted by me earlier this year (I do not believe family followed through with PT as the patient left the country) and PT was consulted at the hospital, did not qualify for home health PT.   Nausea and vomiting likely due to esophagitis per hospital notes. CT scan was positive for this. She was started on PPI.   There was question of cognitive impairment vs possible mood disorder. Psychiatrist was consulted during hospital stay but patient denied depressive symptoms to the specialist. However later she reported depressive symptoms to hospitalitis so she was started on Remeron.   All other medications for DM II and HTN were continued.   Today son reports that the patient is fatigued all the time and sleeps day and night, still having poor appetite and coughing when trying to eat/drink. No more vomiting. They misunderstood and never picked up remeron or PPI since leaving hospital.   Past Medical History  Diagnosis Date  . Diabetes mellitus without complication ([place])   . Hypertension   . Stroke ([place])   . Hyperlipidemia     Patient Active Problem List   Diagnosis Date Noted  . Mood disorder in conditions classified elsewhere   . TIA (transient ischemic attack) 01/18/2015   . History of CVA (cerebrovascular accident) without residual deficits 09/18/2014  . Paresthesia of left upper extremity 09/18/2014  . Controlled type 2 DM with peripheral circulatory disorder ([place]) 09/18/2014  . Hyperlipidemia LDL goal <70 09/18/2014  . Hypertension goal BP (blood pressure) < 140/90 09/18/2014    Social History  Substance Use Topics  . Smoking status: Never Smoker   . Smokeless tobacco: Not on file  . Alcohol Use: No     Current outpatient prescriptions:  .  aspirin 325 MG tablet, Take 1 tablet (325 mg total) by mouth daily., Disp: 120 tablet, Rfl: 0 .  atorvastatin (LIPITOR) 10 MG tablet, Take 1 tablet (10 mg total) by mouth daily., Disp: 90 tablet, Rfl: 2 .  glimepiride (AMARYL) 2 MG tablet, Take 1 tablet (2 mg total) by mouth 3 (three) times daily., Disp: 270 tablet, Rfl: 2 .  lisinopril-hydrochlorothiazide (ZESTORETIC) 20-12.5 MG tablet, Take 1 tablet by mouth 2 (two) times daily., Disp: 180 tablet, Rfl: 2 .  metFORMIN (GLUCOPHAGE) 500 MG tablet, Take 1 tablet (500 mg total) by mouth 3 (three) times daily., Disp: 270 tablet, Rfl: 2 .  mirtazapine (REMERON) 7.5 MG tablet, Take 1 tablet (7.5 mg total) by mouth at bedtime., Disp: 30 tablet, Rfl: 0 .  pantoprazole (PROTONIX) 40 MG tablet, Take 1 tablet (40 mg total) by mouth daily., Disp: 30 tablet, Rfl: 0  No past surgical history on file.  Family History  Problem Relation Age of Onset  . Hyperlipidemia  Mother   . Hypertension Mother   . Hyperlipidemia Father   . Hypertension Father     No Known Allergies   Review of Systems  CONSTITUTIONAL: No significant weight changes, fever, chills, weakness or fatigue.  SKIN: No rash or itching.  CARDIOVASCULAR: No chest pain, chest pressure or chest discomfort. No palpitations or edema.  RESPIRATORY: No shortness of breath, cough or sputum.  GASTROINTESTINAL: No anorexia, nausea, vomiting. No changes in bowel habits. No abdominal pain or blood.  NEUROLOGICAL: No  headache, dizziness, syncope, paralysis, ataxia, numbness or tingling in the extremities. No memory changes. No change in bowel or bladder control.  MUSCULOSKELETAL: No joint pain. No muscle pain. HEMATOLOGIC: No anemia, bleeding or bruising.  LYMPHATICS: No enlarged lymph nodes.  PSYCHIATRIC: No change in mood. No change in sleep pattern.  ENDOCRINOLOGIC: No reports of sweating, cold or heat intolerance. No polyuria or polydipsia.     Objective  There were no vitals taken for this visit. There is no weight on file to calculate BMI.  Physical Exam  Constitutional: Patient appears well-developed and well-nourished. In no distress. Quite and a bit more fatigued appearing than usual but cooperative. Using cane to walk. Cardiovascular: Normal rate, regular rhythm and normal heart sounds. No murmur heard.  Pulmonary/Chest: Effort normal and breath sounds normal. No respiratory distress. Musculoskeletal: Left LE 4/5 strength, Left UE 3/5 strength. Sensation intact.  Peripheral vascular: Bilateral LE no edema. Neurological: CN II-XII grossly intact with no focal deficits. Alert and oriented to person, place, and time.  Skin: Skin is warm and dry. No rash noted. No erythema.  Psychiatric: Patient has a stable mood and affect. Behavior is normal in office today. Judgment and thought content normal in office today.  Recent Results (from the past 2160 hour(s))  Hemoglobin A1c     Status: Abnormal   Collection Time: 12/16/14  2:12 PM  Result Value Ref Range   Hgb A1c MFr Bld 6.2 (H) 4.8 - 5.6 %    Comment:          Pre-diabetes: 5.7 - 6.4          Diabetes: >6.4          Glycemic control for adults with diabetes: <7.0    Est. average glucose Bld gHb Est-mCnc 131 mg/dL  Lipid panel     Status: Abnormal   Collection Time: 12/16/14  2:12 PM  Result Value Ref Range   Cholesterol, Total 149 100 - 199 mg/dL   Triglycerides 146 0 - 149 mg/dL   HDL 39 (L) >39 mg/dL   VLDL Cholesterol Cal 29  5 - 40 mg/dL   LDL Calculated 81 0 - 99 mg/dL   Chol/HDL Ratio 3.8 0.0 - 4.4 ratio units    Comment:                                   T. Chol/HDL Ratio                                             Men  Women                               1/2 Avg.Risk  3.4    3.3  Avg.Risk  5.0    4.4                                2X Avg.Risk  9.6    7.1                                3X Avg.Risk 23.4   78.6   Basic metabolic panel     Status: Abnormal   Collection Time: 01/18/15  1:41 PM  Result Value Ref Range   Sodium 138 135 - 145 mmol/L   Potassium 4.3 3.5 - 5.1 mmol/L    Comment: HEMOLYSIS AT THIS LEVEL MAY AFFECT RESULT   Chloride 105 101 - 111 mmol/L   CO2 26 22 - 32 mmol/L   Glucose, Bld 95 65 - 99 mg/dL   BUN 24 (H) 6 - 20 mg/dL   Creatinine, Ser 0.84 0.44 - 1.00 mg/dL   Calcium 8.9 8.9 - 10.3 mg/dL   GFR calc non Af Amer >60 >60 mL/min   GFR calc Af Amer >60 >60 mL/min    Comment: (NOTE) The eGFR has been calculated using the CKD EPI equation. This calculation has not been validated in all clinical situations. eGFR's persistently <60 mL/min signify possible Chronic Kidney Disease.    Anion gap 7 5 - 15  CBC     Status: Abnormal   Collection Time: 01/18/15  1:41 PM  Result Value Ref Range   WBC 5.1 3.6 - 11.0 K/uL   RBC 4.14 3.80 - 5.20 MIL/uL   Hemoglobin 11.8 (L) 12.0 - 16.0 g/dL   HCT 35.6 35.0 - 47.0 %   MCV 86.1 80.0 - 100.0 fL   MCH 28.4 26.0 - 34.0 pg   MCHC 33.0 32.0 - 36.0 g/dL   RDW 13.6 11.5 - 14.5 %   Platelets 142 (L) 150 - 440 K/uL  Troponin I     Status: None   Collection Time: 01/18/15  1:41 PM  Result Value Ref Range   Troponin I <0.03 <0.031 ng/mL    Comment:        NO INDICATION OF MYOCARDIAL INJURY.   Lipase, blood     Status: None   Collection Time: 01/18/15  1:45 PM  Result Value Ref Range   Lipase 29 11 - 51 U/L  Hepatic function panel     Status: Abnormal   Collection Time: 01/18/15  1:45 PM  Result Value Ref  Range   Total Protein 6.7 6.5 - 8.1 g/dL   Albumin 4.0 3.5 - 5.0 g/dL   AST 19 15 - 41 U/L    Comment: HEMOLYSIS AT THIS LEVEL MAY AFFECT RESULT   ALT 11 (L) 14 - 54 U/L   Alkaline Phosphatase 56 38 - 126 U/L   Total Bilirubin 0.9 0.3 - 1.2 mg/dL    Comment: HEMOLYSIS AT THIS LEVEL MAY AFFECT RESULT   Bilirubin, Direct 0.3 0.1 - 0.5 mg/dL    Comment: HEMOLYSIS AT THIS LEVEL MAY AFFECT RESULT   Indirect Bilirubin 0.6 0.3 - 0.9 mg/dL  Troponin I     Status: None   Collection Time: 01/18/15  6:25 PM  Result Value Ref Range   Troponin I <0.03 <0.031 ng/mL    Comment:        NO INDICATION OF MYOCARDIAL INJURY.   Urinalysis complete, with microscopic  Status: Abnormal   Collection Time: 01/18/15  7:37 PM  Result Value Ref Range   Color, Urine STRAW (A) YELLOW   APPearance CLEAR (A) CLEAR   Glucose, UA NEGATIVE NEGATIVE mg/dL   Bilirubin Urine NEGATIVE NEGATIVE   Ketones, ur NEGATIVE NEGATIVE mg/dL   Specific Gravity, Urine 1.008 1.005 - 1.030   Hgb urine dipstick NEGATIVE NEGATIVE   pH 6.0 5.0 - 8.0   Protein, ur NEGATIVE NEGATIVE mg/dL   Nitrite NEGATIVE NEGATIVE   Leukocytes, UA 2+ (A) NEGATIVE   RBC / HPF 0-5 0 - 5 RBC/hpf   WBC, UA 0-5 0 - 5 WBC/hpf   Bacteria, UA NONE SEEN NONE SEEN   Squamous Epithelial / LPF 0-5 (A) NONE SEEN   Mucous PRESENT   Urine culture     Status: None   Collection Time: 01/18/15  7:37 PM  Result Value Ref Range   Specimen Description URINE, RANDOM    Special Requests NONE    Culture      20,000 COLONIES/mL ESCHERICHIA COLI Susceptibility Pattern Suggests Possibility of an Extended Spectrum Beta Lactamase Producer. Contact Laboratory Within 7 Days if Confirmation Warranted. Results Called to: Dr. Marcille Blanco @ 1254 01/21/15 by DLV    Report Status 01/21/2015 FINAL    Organism ID, Bacteria ESCHERICHIA COLI       Susceptibility   Escherichia coli - MIC*    AMPICILLIN >=32 RESISTANT Resistant     CEFTAZIDIME 4 RESISTANT Resistant      CEFAZOLIN >=64 RESISTANT Resistant     CEFTRIAXONE >=64 RESISTANT Resistant     CIPROFLOXACIN >=4 RESISTANT Resistant     GENTAMICIN <=1 SENSITIVE Sensitive     IMIPENEM <=0.25 SENSITIVE Sensitive     TRIMETH/SULFA <=20 SENSITIVE Sensitive     Extended ESBL POSITIVE Resistant     PIP/TAZO Value in next row Sensitive      SENSITIVE<=4    * 20,000 COLONIES/mL ESCHERICHIA COLI  Glucose, capillary     Status: Abnormal   Collection Time: 01/19/15  2:11 AM  Result Value Ref Range   Glucose-Capillary 105 (H) 65 - 99 mg/dL   Comment 1 Notify RN   Basic metabolic panel     Status: Abnormal   Collection Time: 01/19/15  5:56 AM  Result Value Ref Range   Sodium 141 135 - 145 mmol/L   Potassium 4.0 3.5 - 5.1 mmol/L   Chloride 109 101 - 111 mmol/L   CO2 26 22 - 32 mmol/L   Glucose, Bld 106 (H) 65 - 99 mg/dL   BUN 19 6 - 20 mg/dL   Creatinine, Ser 0.79 0.44 - 1.00 mg/dL   Calcium 8.7 (L) 8.9 - 10.3 mg/dL   GFR calc non Af Amer >60 >60 mL/min   GFR calc Af Amer >60 >60 mL/min    Comment: (NOTE) The eGFR has been calculated using the CKD EPI equation. This calculation has not been validated in all clinical situations. eGFR's persistently <60 mL/min signify possible Chronic Kidney Disease.    Anion gap 6 5 - 15  CBC     Status: Abnormal   Collection Time: 01/19/15  5:56 AM  Result Value Ref Range   WBC 6.2 3.6 - 11.0 K/uL   RBC 3.85 3.80 - 5.20 MIL/uL   Hemoglobin 11.2 (L) 12.0 - 16.0 g/dL   HCT 33.1 (L) 35.0 - 47.0 %   MCV 85.9 80.0 - 100.0 fL   MCH 29.1 26.0 - 34.0 pg   MCHC 33.9 32.0 -  36.0 g/dL   RDW 13.6 11.5 - 14.5 %   Platelets 134 (L) 150 - 440 K/uL  Hemoglobin A1c     Status: None   Collection Time: 01/19/15  5:56 AM  Result Value Ref Range   Hgb A1c MFr Bld 5.3 4.0 - 6.0 %  Lipid panel     Status: Abnormal   Collection Time: 01/19/15  5:56 AM  Result Value Ref Range   Cholesterol 138 0 - 200 mg/dL   Triglycerides 111 <150 mg/dL   HDL 31 (L) >40 mg/dL   Total CHOL/HDL  Ratio 4.5 RATIO   VLDL 22 0 - 40 mg/dL   LDL Cholesterol 85 0 - 99 mg/dL    Comment:        Total Cholesterol/HDL:CHD Risk Coronary Heart Disease Risk Table                     Men   Women  1/2 Average Risk   3.4   3.3  Average Risk       5.0   4.4  2 X Average Risk   9.6   7.1  3 X Average Risk  23.4   11.0        Use the calculated Patient Ratio above and the CHD Risk Table to determine the patient's CHD Risk.        ATP III CLASSIFICATION (LDL):  <100     mg/dL   Optimal  100-129  mg/dL   Near or Above                    Optimal  130-159  mg/dL   Borderline  160-189  mg/dL   High  >190     mg/dL   Very High   Glucose, capillary     Status: Abnormal   Collection Time: 01/19/15  7:37 AM  Result Value Ref Range   Glucose-Capillary 101 (H) 65 - 99 mg/dL   Comment 1 Notify RN   Glucose, capillary     Status: Abnormal   Collection Time: 01/19/15 11:15 AM  Result Value Ref Range   Glucose-Capillary 345 (H) 65 - 99 mg/dL   Comment 1 Notify RN   Glucose, capillary     Status: Abnormal   Collection Time: 01/19/15  4:29 PM  Result Value Ref Range   Glucose-Capillary 136 (H) 65 - 99 mg/dL   Comment 1 Notify RN   Glucose, capillary     Status: Abnormal   Collection Time: 01/19/15 10:25 PM  Result Value Ref Range   Glucose-Capillary 162 (H) 65 - 99 mg/dL  Glucose, capillary     Status: Abnormal   Collection Time: 01/20/15  8:07 AM  Result Value Ref Range   Glucose-Capillary 210 (H) 65 - 99 mg/dL  Glucose, capillary     Status: Abnormal   Collection Time: 01/20/15 11:03 AM  Result Value Ref Range   Glucose-Capillary 133 (H) 65 - 99 mg/dL   Comment 1 Notify RN      Assessment & Plan  1. Esophagitis Clarified through interpreter that she needs to start PPI, I printed out the Rx for them. Likely contributing to poor appetite.   - pantoprazole (PROTONIX) 40 MG tablet; Take 1 tablet (40 mg total) by mouth daily.  Dispense: 90 tablet; Refill: 2 - Ambulatory referral to  Gastroenterology  2. Mood disorder in conditions classified elsewhere Clarified through interpreter that she needs to start Remeron, may help with mood stabilization  and increase appetite.   If fatigue continues consider testing for UTI and Thyroid disorder.   - mirtazapine (REMERON) 7.5 MG tablet; Take 1 tablet (7.5 mg total) by mouth at bedtime.  Dispense: 90 tablet; Refill: 2  3. Hemiparesis affecting left side as late effect of cerebrovascular accident (CVA) ([place]) Stable.  4. History of CVA with residual deficit Medication management, looked at all of their other medication bottlers, has 90 day supply and 2 refills left of everything.

## 2015-02-22 ENCOUNTER — Ambulatory Visit (INDEPENDENT_AMBULATORY_CARE_PROVIDER_SITE_OTHER): Payer: Medicaid Other | Admitting: Family Medicine

## 2015-02-22 ENCOUNTER — Encounter: Payer: Self-pay | Admitting: Family Medicine

## 2015-02-22 VITALS — BP 132/60 | HR 103 | Temp 98.9°F | Resp 16 | Wt 135.9 lb

## 2015-02-22 DIAGNOSIS — F0630 Mood disorder due to known physiological condition, unspecified: Secondary | ICD-10-CM

## 2015-02-22 DIAGNOSIS — R42 Dizziness and giddiness: Secondary | ICD-10-CM | POA: Diagnosis not present

## 2015-02-22 DIAGNOSIS — E1151 Type 2 diabetes mellitus with diabetic peripheral angiopathy without gangrene: Secondary | ICD-10-CM

## 2015-02-22 LAB — GLUCOSE, POCT (MANUAL RESULT ENTRY): POC GLUCOSE: 258 mg/dL — AB (ref 70–99)

## 2015-02-22 NOTE — Progress Notes (Addendum)
Name: Annette Crawford   MRN: [id number]    DOB: [birth year]   Date:02/22/2015       Progress Note  Subjective  Chief Complaint  Chief Complaint  Patient presents with  . Dizziness    HPI  Annette Crawford is a 81 year old female with known history of DM II, HTN, HLD, CVA with residual deficits left upper extremity worse than lower. Today she is here accompanied by her son Lourena Simmonds (caretaker) and granddaughter for concerns of dizziness for past 5 days. Language barrier noted. Patient does not speak or understand [place] and family has very broken understanding of [place]. Interpretive services used through the phone. Son reports that mother is walking unsteady and says she is dizzy when getting up from sitting position. NO falls. Her sugars have been low 80 after eating once. So he did not give her any medication today so far until he saw [place]. Onset of dizziness also correlated with Remeron initiation.   If you may recall she was seen in the hospital a month ago with nausea and vomiting, recurrent CVA ruled out, she was diagnosed with GERD and Depression, prescribed PPI and Remeron.   PPI helping with cough and she reports improved appetite.   Past Medical History  Diagnosis Date  . Diabetes mellitus without complication ([facility])   . Hypertension   . Stroke ([facility])   . Hyperlipidemia     Patient Active Problem List   Diagnosis Date Noted  . Dizziness 02/22/2015  . Esophagitis 01/27/2015  . History of CVA with residual deficit 01/27/2015  . Mood disorder in conditions classified elsewhere   . Hemiparesis affecting left side as late effect of cerebrovascular accident (CVA) ([facility]) 09/18/2014  . Paresthesia of left upper extremity 09/18/2014  . Controlled type 2 DM with peripheral circulatory disorder ([facility]) 09/18/2014  . Hyperlipidemia LDL goal <70 09/18/2014  . Hypertension goal BP (blood pressure) < 140/90 09/18/2014    Social History  Substance Use Topics  . Smoking status: Never  Smoker   . Smokeless tobacco: Not on file  . Alcohol Use: No     Current outpatient prescriptions:  .  aspirin 325 MG tablet, Take 1 tablet (325 mg total) by mouth daily., Disp: 120 tablet, Rfl: 0 .  atorvastatin (LIPITOR) 10 MG tablet, Take 1 tablet (10 mg total) by mouth daily., Disp: 90 tablet, Rfl: 2 .  glimepiride (AMARYL) 2 MG tablet, Take 1 tablet (2 mg total) by mouth 3 (three) times daily., Disp: 270 tablet, Rfl: 2 .  lisinopril-hydrochlorothiazide (ZESTORETIC) 20-12.5 MG tablet, Take 1 tablet by mouth 2 (two) times daily., Disp: 180 tablet, Rfl: 2 .  metFORMIN (GLUCOPHAGE) 500 MG tablet, Take 1 tablet (500 mg total) by mouth 3 (three) times daily., Disp: 270 tablet, Rfl: 2 .  mirtazapine (REMERON) 7.5 MG tablet, Take 1 tablet (7.5 mg total) by mouth at bedtime., Disp: 90 tablet, Rfl: 2 .  pantoprazole (PROTONIX) 40 MG tablet, Take 1 tablet (40 mg total) by mouth daily., Disp: 90 tablet, Rfl: 2  No past surgical history on file.  Family History  Problem Relation Age of Onset  . Hyperlipidemia Mother   . Hypertension Mother   . Hyperlipidemia Father   . Hypertension Father     No Known Allergies   Review of Systems  CONSTITUTIONAL: No significant weight changes, fever, chills, weakness or fatigue.  HEENT:  - Eyes: No visual changes.  - Ears: No auditory changes. No pain.  - Nose: No sneezing,  congestion, runny nose. - Throat: No sore throat. No changes in swallowing. SKIN: No rash or itching.  CARDIOVASCULAR: No chest pain, chest pressure or chest discomfort. No palpitations or edema.  RESPIRATORY: No shortness of breath, cough or sputum.  GASTROINTESTINAL: No anorexia, nausea, vomiting. No changes in bowel habits. No abdominal pain or blood.  GENITOURINARY: No dysuria. No frequency. No discharge. NEUROLOGICAL: Yes dizziness. No headache, syncope, paralysis, ataxia, numbness or tingling in the extremities. Stable weakness in left UE. No memory changes. No change in  bowel or bladder control.  MUSCULOSKELETAL: No joint pain. No muscle pain. HEMATOLOGIC: No anemia, bleeding or bruising.  LYMPHATICS: No enlarged lymph nodes.  PSYCHIATRIC: No change in mood. No change in sleep pattern.  ENDOCRINOLOGIC: No reports of sweating, cold or heat intolerance. No polyuria or polydipsia.     Objective  BP 132/60 mmHg  Pulse 103  Temp(Src) 98.9 F (37.2 C) (Oral)  Resp 16  Wt 135 lb 14.4 oz (61.644 kg)  SpO2 97% Body mass index is 22.62 kg/(m^2).  Physical Exam  Constitutional: Patient appears well-developed and well-nourished. In no distress. More verbally interactive today and cooperative. Using cane to walk. Cardiovascular: Normal rate, regular rhythm and 2/6 SEM.  Pulmonary/Chest: Effort normal and breath sounds normal. No respiratory distress. Musculoskeletal: Left LE 4/5 strength, Left UE 3/5 strength. Sensation intact.  Peripheral vascular: Bilateral LE no edema. Neurological: CN II-XII grossly intact with no focal deficits. Alert and oriented to person, place, and time. Gait is cautious and needs assistance to walk. Skin: Skin is warm and dry. No rash noted. No erythema.  Psychiatric: Patient has a stable mood and affect. Behavior is normal in office today. Judgment and thought content normal in office today.  Results for orders placed or performed in visit on 02/22/15 (from the past 24 hour(s))  POCT Glucose (CBG)     Status: Abnormal   Collection Time: 02/22/15  3:17 PM  Result Value Ref Range   POC Glucose 258 (A) 70 - 99 mg/dl    Assessment & Plan  1. Dizziness May be due to previous CVA (no evidence of new focal deficit on exam), glucose, Remeron, vertigo. We discussed reducing her risks of fall but stopping Remeron and Glimeperide.   - POCT Glucose (CBG)  2. Mood disorder in conditions classified elsewhere Stop Remeron   3. Controlled type 2 DM with peripheral circulatory disorder ([facility]) Glucose fairly high today but she has  not taken her medications. I recommended DC Glimeperide all together, may keep bottle of pills on hand if glucose > 200 mg/dL they may give her 1 tablet. Otherwise continue Metformin daily as prescribed.

## 2015-03-04 ENCOUNTER — Ambulatory Visit: Payer: Self-pay | Admitting: Family Medicine

## 2015-04-09 ENCOUNTER — Telehealth: Payer: Self-pay | Admitting: Family Medicine

## 2015-04-09 NOTE — Telephone Encounter (Signed)
Do you need to see her?

## 2015-04-09 NOTE — Telephone Encounter (Signed)
She should have 90 day supply refills still at pharmacy to pick up before her trip. As long as she is stable from when I saw her in January 2017 she should be fine to fly without an office visit.

## 2015-04-09 NOTE — Telephone Encounter (Signed)
[place] is [place] seas and is needed to be seen next wk and dr has nothing and she needs med refills and get the ok to fly. Pt will be gone 3months and the flight will take 15 hrs. Son is very persistant that he get an appt.

## 2015-06-15 ENCOUNTER — Ambulatory Visit: Payer: Medicaid Other | Admitting: Family Medicine

## 2016-08-18 ENCOUNTER — Telehealth: Payer: Self-pay

## 2016-08-18 NOTE — Telephone Encounter (Signed)
Denied - needs appt

## 2016-08-18 NOTE — Telephone Encounter (Signed)
Pharmacy asking new RX for  ACCU-Chek avia plus test strip and Softclix lancets MIS

## 2016-08-21 NOTE — Telephone Encounter (Signed)
Tried calling pt but it was the wrong number

## 2017-10-15 IMAGING — CT CT ABD-PELV W/ CM
1 of 3 series · 14 of 32 positions shown, 19 images · IV contrast (omnipaque)
Comparison: 06/01/2014

CLINICAL DATA: Patient has had vomiting for two days. She has had
constipation. She denies abdominal pain.

EXAM:
CT ABDOMEN AND PELVIS WITH CONTRAST
TECHNIQUE: Multidetector CT imaging of the abdomen and pelvis was performed
using the standard protocol following bolus administration of
intravenous contrast.
CONTRAST:  100mL OMNIPAQUE IOHEXOL 300 MG/ML  SOLN

[Series 2: routine abd pel with · axial · 0.72mm/px · z∈[-432,-52]mm · 14 of 86 slices shown, 19 images]
[im 5/86  soft-tissue]
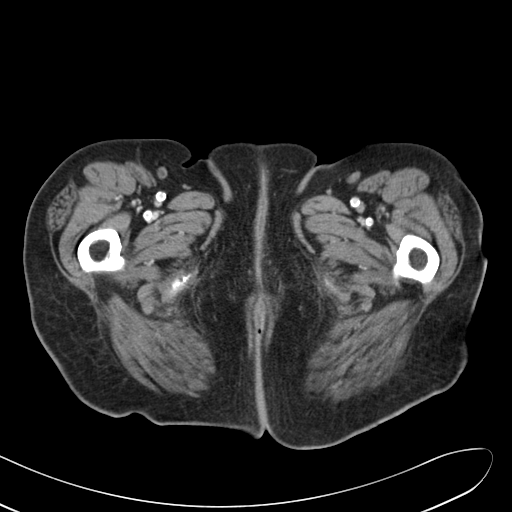
[im 5/86  bone]
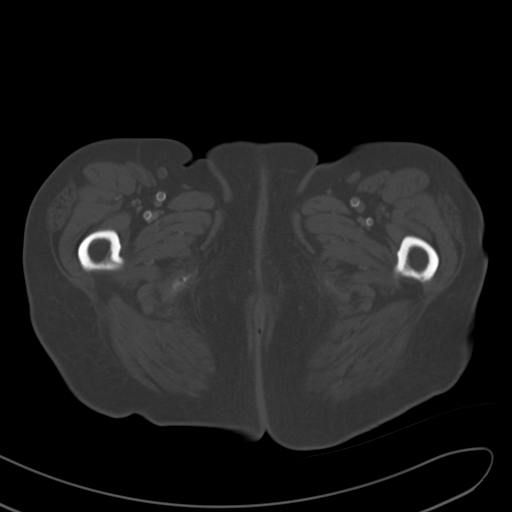
[im 13/86  soft-tissue]
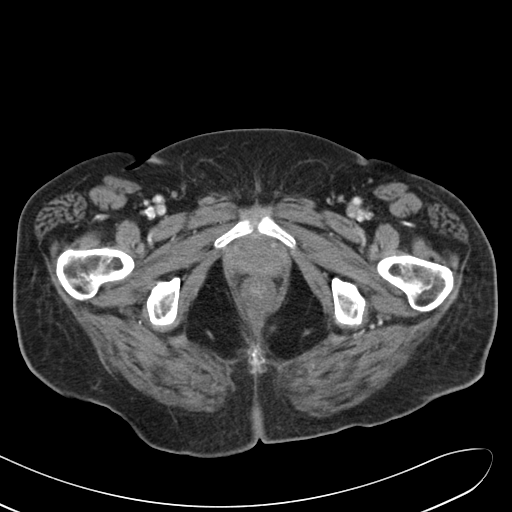
[im 18/86  soft-tissue]
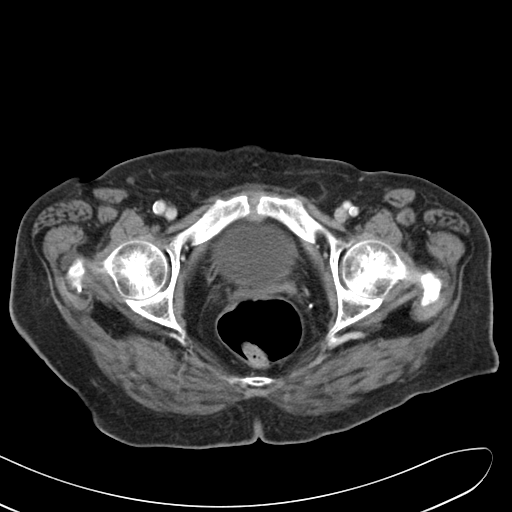
[im 26/86  soft-tissue]
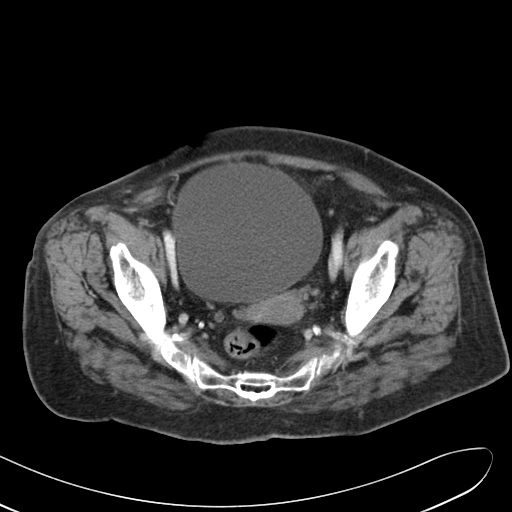
[im 30/86  soft-tissue]
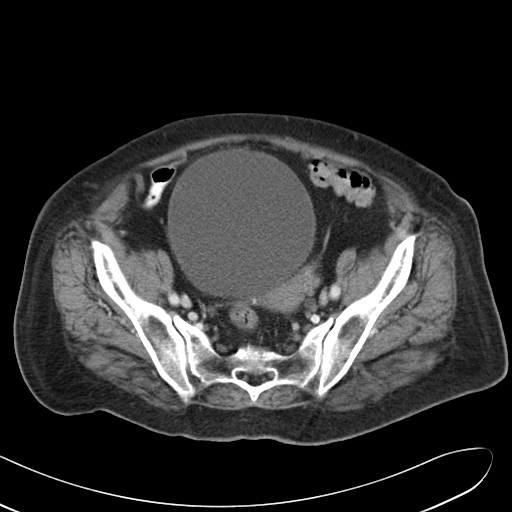
[im 39/86  soft-tissue]
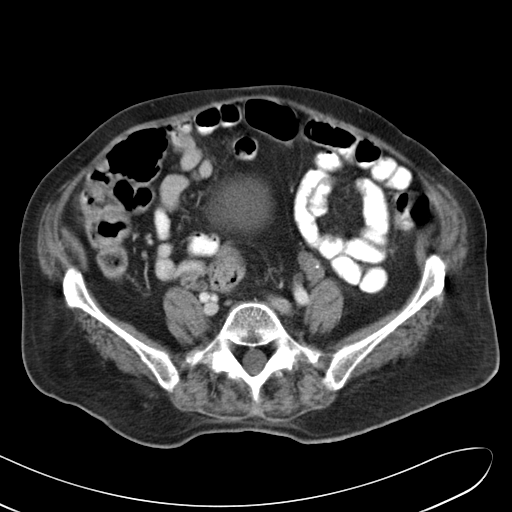
[im 43/86  soft-tissue]
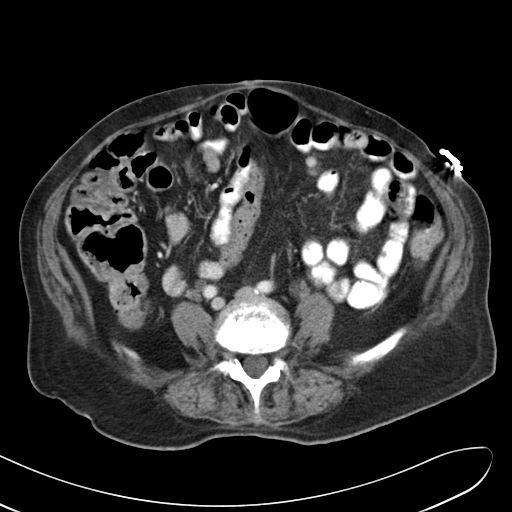
[im 47/86  soft-tissue]
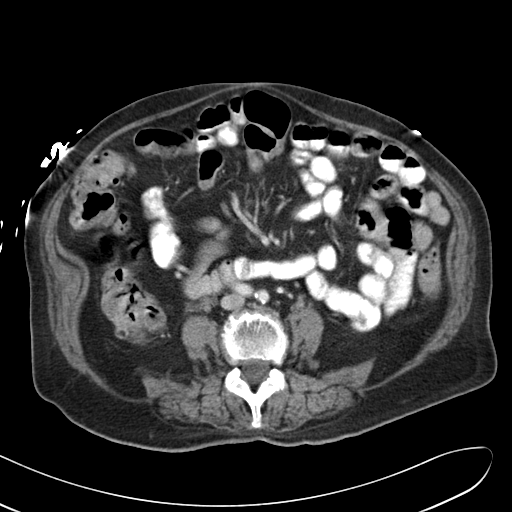
[im 56/86  soft-tissue]
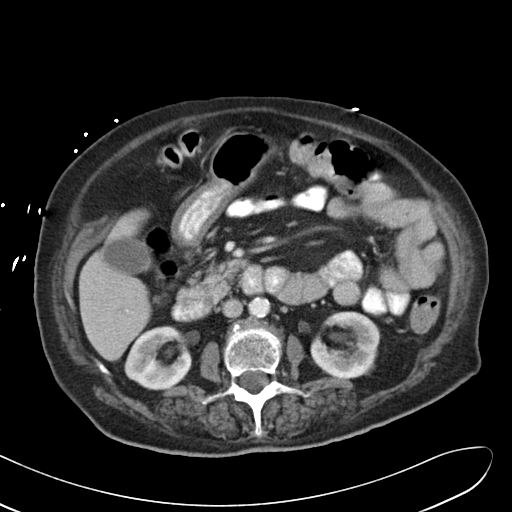
[im 56/86  bone]
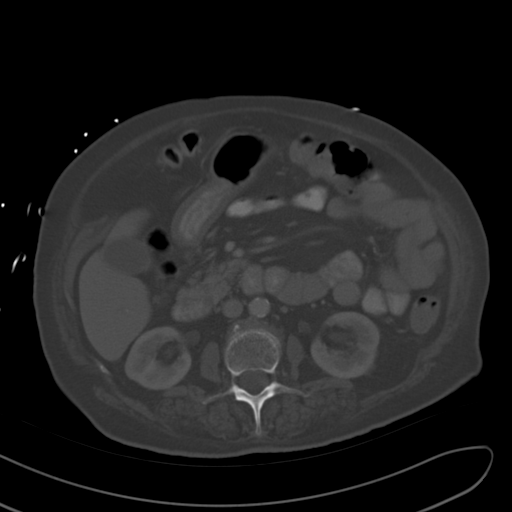
[im 60/86  soft-tissue]
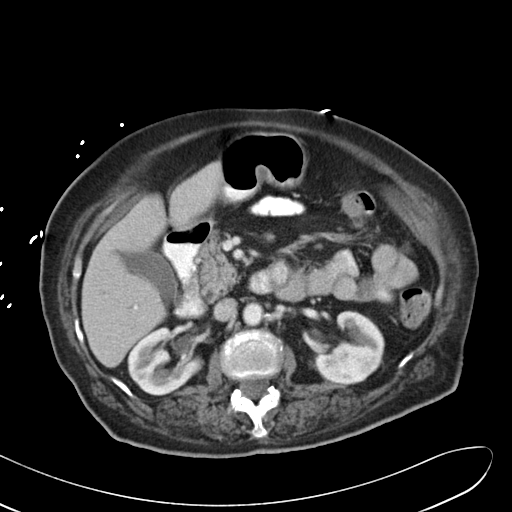
[im 69/86  soft-tissue]
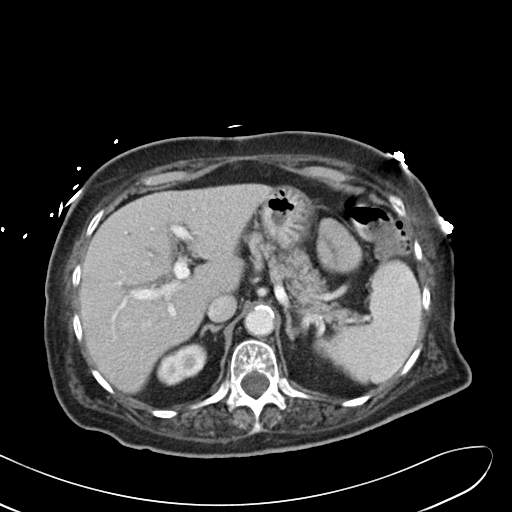
[im 69/86  lung]
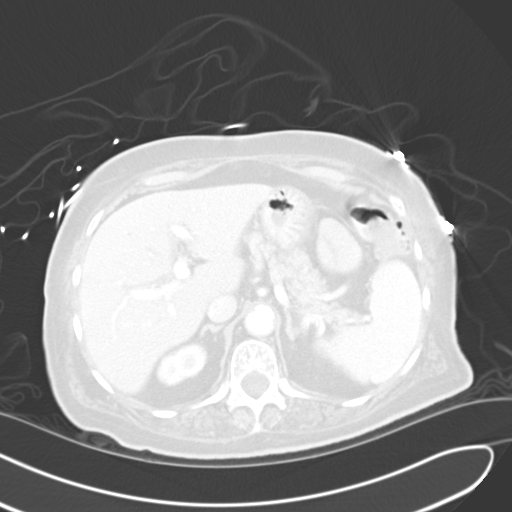
[im 73/86  soft-tissue]
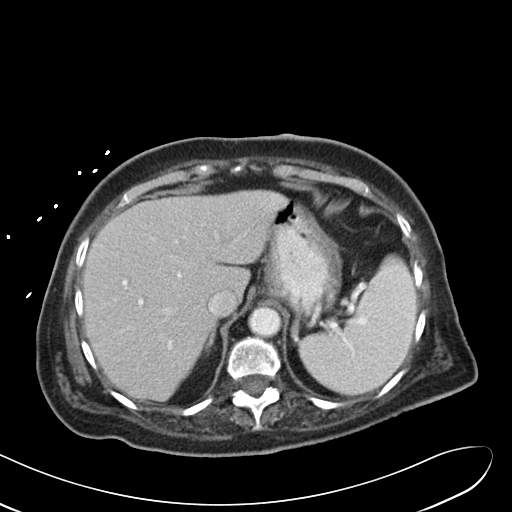
[im 73/86  lung]
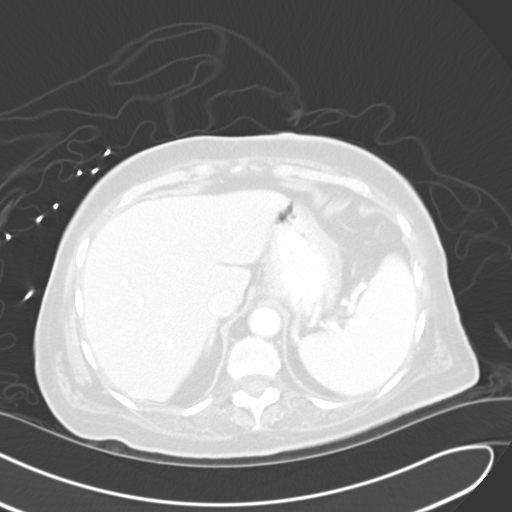
[im 77/86  lung]
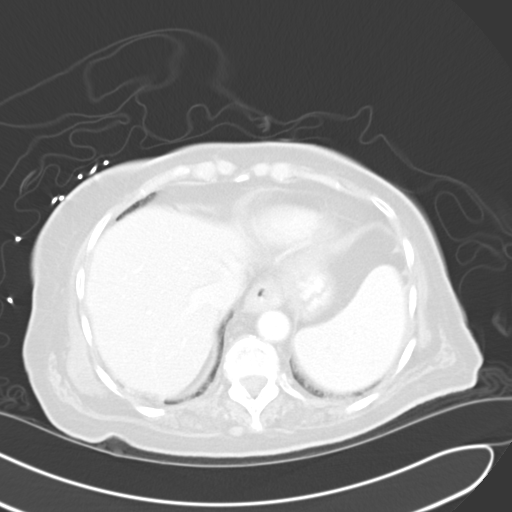
[im 81/86  soft-tissue]
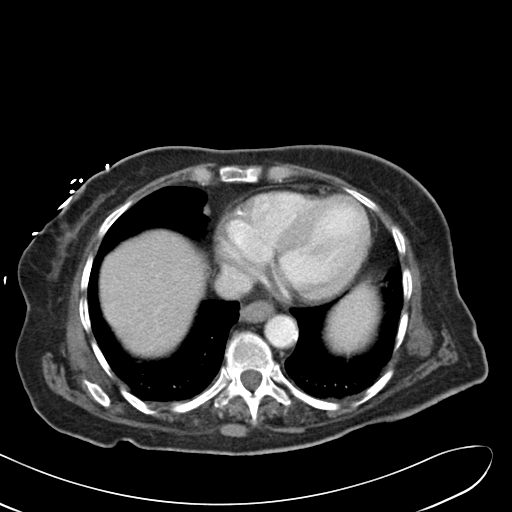
[im 81/86  lung]
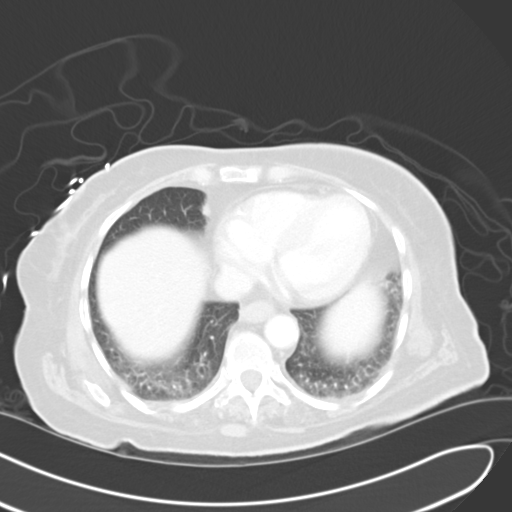

[14 of 32 positions shown; findings below may reference images not displayed]

FINDINGS: Lower chest:  Mild bibasilar atelectasis.  Mild cardiac enlargement.

Hepatobiliary: Very mild hepatic steatosis.

Pancreas: Normal

Spleen: Normal

Adrenals/Urinary Tract: Adrenal glands are normal. Small
low-attenuation renal lesions bilaterally too small to characterize
a likely cysts. Both ureters mildly prominent likely related to
bladder distention. Nonobstructing 3 mm stone midpole right kidney.

Stomach/Bowel: Distal esophageal wall thickening. Small hiatal
hernia. Small bowel is normal. Appendix is normal. Large bowel
normal. Nonobstructive bowel gas pattern. Fecal retention is not
particularly prominent.

Vascular/Lymphatic: No acute findings

Reproductive: Negative

Other: No ascites

Musculoskeletal: No acute abnormalities
IMPRESSION: Distal esophageal wall thickening suggests esophagitis. There are
otherwise no acute findings.
